# Patient Record
Sex: Female | Born: 1990 | Race: White | Hispanic: No | Marital: Married | Smoking: Never smoker
Health system: Southern US, Community
[De-identification: ages and names within clinical notes are randomized; demographics above are authoritative.]

## PROBLEM LIST (undated history)

## (undated) ENCOUNTER — Inpatient Hospital Stay (HOSPITAL_COMMUNITY): Payer: Self-pay

## (undated) DIAGNOSIS — Z8759 Personal history of other complications of pregnancy, childbirth and the puerperium: Secondary | ICD-10-CM

## (undated) DIAGNOSIS — Z87442 Personal history of urinary calculi: Secondary | ICD-10-CM

## (undated) DIAGNOSIS — N189 Chronic kidney disease, unspecified: Secondary | ICD-10-CM

## (undated) DIAGNOSIS — O009 Unspecified ectopic pregnancy without intrauterine pregnancy: Secondary | ICD-10-CM

## (undated) DIAGNOSIS — E039 Hypothyroidism, unspecified: Secondary | ICD-10-CM

## (undated) HISTORY — PX: WISDOM TOOTH EXTRACTION: SHX21

## (undated) HISTORY — PX: APPENDECTOMY: SHX54

## (undated) HISTORY — PX: LITHOTRIPSY: SUR834

---
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
## 2016-03-04 ENCOUNTER — Encounter (HOSPITAL_COMMUNITY): Payer: Self-pay | Admitting: Family Medicine

## 2016-03-04 ENCOUNTER — Ambulatory Visit (INDEPENDENT_AMBULATORY_CARE_PROVIDER_SITE_OTHER): Payer: Worker's Compensation

## 2016-03-04 ENCOUNTER — Ambulatory Visit (HOSPITAL_COMMUNITY)
Admission: EM | Admit: 2016-03-04 | Discharge: 2016-03-04 | Disposition: A | Discharge status: Home / Self Care | Payer: Worker's Compensation | Attending: Family Medicine | Admitting: Family Medicine

## 2016-03-04 DIAGNOSIS — S61233A Puncture wound without foreign body of left middle finger without damage to nail, initial encounter: Secondary | ICD-10-CM

## 2016-03-04 MED ORDER — DOXYCYCLINE HYCLATE 100 MG PO CAPS: 100.0000 mg | ORAL_CAPSULE | Freq: Two times a day (BID) | ORAL | 0 refills | Status: DC

## 2016-03-04 NOTE — ED Triage Notes (Signed)
Pt here for a piece of aluminum stuck in left middle finger.

## 2016-03-04 NOTE — ED Provider Notes (Signed)
CSN: [id number]     Arrival date & time 03/04/16  1709 History   First MD Initiated Contact with Patient 03/04/16 1805     Chief Complaint  Patient presents with  . Foreign Body   (Consider location/radiation/quality/duration/timing/severity/associated sxs/prior Treatment) The history is provided by the patient.  Foreign Body  Location:  Skin Suspected object:  Metal Pain quality:  Unable to specify Pain severity:  No pain Duration:  2 weeks Timing:  Constant Progression:  Partially resolved Chronicity:  New Worsened by:  Nothing Ineffective treatments:  None tried   History reviewed. No pertinent past medical history. History reviewed. No pertinent surgical history. History reviewed. No pertinent family history. Social History  Substance Use Topics  . Smoking status: Never Smoker  . Smokeless tobacco: Never Used  . Alcohol use Not on file   OB History    No data available     Review of Systems  Constitutional: Negative.   HENT: Negative.   Eyes: Negative.   Respiratory: Negative.   Cardiovascular: Negative.   Gastrointestinal: Negative.   Endocrine: Negative.   Genitourinary: Negative.   Musculoskeletal: Negative.   Skin: Positive for wound.  Allergic/Immunologic: Negative.     Allergies  Patient has no known allergies.  Home Medications   Prior to Admission medications   Medication Sig Start Date End Date Taking? Authorizing Provider  doxycycline (VIBRAMYCIN) 100 MG capsule Take 1 capsule (100 mg total) by mouth 2 (two) times daily. 03/04/16   Deatra CanterWilliam J Yurianna Tusing, FNP   Meds Ordered and Administered this Visit  Medications - No data to display  BP 121/81   Pulse 68   Temp 97.8 F (36.6 C)   Resp 18   SpO2 100%  No data found.   Physical Exam  Constitutional: She appears well-developed and well-nourished.  HENT:  Head: Normocephalic and atraumatic.  Eyes: Conjunctivae and EOM are normal. Pupils are equal, round, and reactive to light.   Cardiovascular: Normal rate, regular rhythm and normal heart sounds.   Pulmonary/Chest: Effort normal and breath sounds normal.  Skin:  Left middle finger with healing puncture wound MIP of left middle finger.  Nursing note and vitals reviewed.   Urgent Care Course   Clinical Course     Procedures (including critical care time)  Labs Review Labs Reviewed - No data to display  Imaging Review Dg Finger Middle Left  Result Date: 03/04/2016 CLINICAL DATA:  Persistent pain of the left middle finger after being greatest with metal grinder at work 1 month ago. Rule out foreign body. EXAM: LEFT MIDDLE FINGER 2+V COMPARISON:  None. FINDINGS: There is no evidence of fracture or dislocation. No radiopaque foreign body identified. Joint spaces are intact. There is no evidence of arthropathy or other focal bone abnormality. Soft tissues are unremarkable. IMPRESSION: Negative. Electronically Signed   By: Tollie Ethavid  Kwon M.D.   On: 03/04/2016 18:34     Visual Acuity Review  Right Eye Distance:   Left Eye Distance:   Bilateral Distance:    Right Eye Near:   Left Eye Near:    Bilateral Near:         MDM   1. Puncture wound of left middle finger    Reassured no FB seen in the skin or joint or finger.  Doxycycline 100mg  one po bid x 10 days #20      Deatra CanterWilliam J Maida Widger, FNP 03/04/16 1907

## 2017-02-24 ENCOUNTER — Other Ambulatory Visit: Payer: Self-pay | Admitting: Adult Health

## 2017-02-24 DIAGNOSIS — N6012 Diffuse cystic mastopathy of left breast: Secondary | ICD-10-CM

## 2017-02-24 DIAGNOSIS — Z8271 Family history of polycystic kidney: Secondary | ICD-10-CM

## 2017-02-26 ENCOUNTER — Other Ambulatory Visit: Payer: Self-pay | Admitting: Adult Health

## 2017-02-26 DIAGNOSIS — Z8271 Family history of polycystic kidney: Secondary | ICD-10-CM

## 2017-03-06 ENCOUNTER — Other Ambulatory Visit: Payer: Self-pay | Admitting: Adult Health

## 2017-03-06 ENCOUNTER — Ambulatory Visit
Admission: RE | Admit: 2017-03-06 | Discharge: 2017-03-06 | Disposition: A | Discharge status: Home / Self Care | Payer: BLUE CROSS/BLUE SHIELD | Source: Ambulatory Visit | Attending: Adult Health | Admitting: Adult Health

## 2017-03-06 DIAGNOSIS — N6012 Diffuse cystic mastopathy of left breast: Secondary | ICD-10-CM

## 2017-03-06 DIAGNOSIS — Z8271 Family history of polycystic kidney: Secondary | ICD-10-CM

## 2017-10-13 ENCOUNTER — Other Ambulatory Visit (HOSPITAL_COMMUNITY)
Admission: RE | Admit: 2017-10-13 | Discharge: 2017-10-13 | Disposition: A | Discharge status: Home / Self Care | Payer: BLUE CROSS/BLUE SHIELD | Source: Ambulatory Visit | Attending: Family Medicine | Admitting: Family Medicine

## 2017-10-13 ENCOUNTER — Other Ambulatory Visit: Payer: Self-pay | Admitting: Family Medicine

## 2017-10-13 DIAGNOSIS — Z01419 Encounter for gynecological examination (general) (routine) without abnormal findings: Secondary | ICD-10-CM | POA: Insufficient documentation

## 2017-10-15 LAB — CYTOLOGY - PAP: DIAGNOSIS: NEGATIVE

## 2019-01-10 ENCOUNTER — Other Ambulatory Visit: Payer: Self-pay

## 2019-01-10 DIAGNOSIS — Z20822 Contact with and (suspected) exposure to covid-19: Secondary | ICD-10-CM

## 2019-01-12 LAB — NOVEL CORONAVIRUS, NAA: SARS-CoV-2, NAA: NOT DETECTED

## 2019-06-13 ENCOUNTER — Inpatient Hospital Stay (HOSPITAL_COMMUNITY)
Admission: AD | Admit: 2019-06-13 | Discharge: 2019-06-13 | Disposition: A | Discharge status: Home / Self Care | Payer: 59 | Attending: Obstetrics and Gynecology | Admitting: Obstetrics and Gynecology

## 2019-06-13 ENCOUNTER — Other Ambulatory Visit: Payer: Self-pay

## 2019-06-13 ENCOUNTER — Encounter (HOSPITAL_COMMUNITY): Payer: Self-pay | Admitting: Obstetrics and Gynecology

## 2019-06-13 ENCOUNTER — Inpatient Hospital Stay (HOSPITAL_COMMUNITY): Payer: 59

## 2019-06-13 ENCOUNTER — Emergency Department (HOSPITAL_COMMUNITY)
Admission: EM | Admit: 2019-06-13 | Discharge: 2019-06-13 | Discharge status: Absent without leave | Payer: BLUE CROSS/BLUE SHIELD

## 2019-06-13 DIAGNOSIS — O26891 Other specified pregnancy related conditions, first trimester: Secondary | ICD-10-CM

## 2019-06-13 DIAGNOSIS — O009 Unspecified ectopic pregnancy without intrauterine pregnancy: Secondary | ICD-10-CM | POA: Insufficient documentation

## 2019-06-13 DIAGNOSIS — O00101 Right tubal pregnancy without intrauterine pregnancy: Secondary | ICD-10-CM | POA: Diagnosis not present

## 2019-06-13 DIAGNOSIS — N939 Abnormal uterine and vaginal bleeding, unspecified: Secondary | ICD-10-CM

## 2019-06-13 DIAGNOSIS — Z6791 Unspecified blood type, Rh negative: Secondary | ICD-10-CM

## 2019-06-13 LAB — URINALYSIS, ROUTINE W REFLEX MICROSCOPIC
Bacteria, UA: NONE SEEN
Bilirubin Urine: NEGATIVE
Glucose, UA: NEGATIVE mg/dL
Ketones, ur: NEGATIVE mg/dL
Leukocytes,Ua: NEGATIVE
Nitrite: NEGATIVE
Protein, ur: NEGATIVE mg/dL
Specific Gravity, Urine: 1.009 (ref 1.005–1.030)
pH: 6 (ref 5.0–8.0)

## 2019-06-13 LAB — COMPREHENSIVE METABOLIC PANEL
ALT: 14 U/L (ref 0–44)
AST: 17 U/L (ref 15–41)
Albumin: 4.2 g/dL (ref 3.5–5.0)
Alkaline Phosphatase: 38 U/L (ref 38–126)
Anion gap: 10 (ref 5–15)
BUN: 12 mg/dL (ref 6–20)
CO2: 24 mmol/L (ref 22–32)
Calcium: 9.4 mg/dL (ref 8.9–10.3)
Chloride: 102 mmol/L (ref 98–111)
Creatinine, Ser: 0.84 mg/dL (ref 0.44–1.00)
GFR calc Af Amer: >60 mL/min (ref >60)
GFR calc non Af Amer: >60 mL/min (ref >60)
Glucose, Bld: 94 mg/dL (ref 70–99)
Potassium: 4.1 mmol/L (ref 3.5–5.1)
Sodium: 136 mmol/L (ref 135–145)
Total Bilirubin: 0.7 mg/dL (ref 0.3–1.2)
Total Protein: 6.6 g/dL (ref 6.5–8.1)

## 2019-06-13 LAB — CBC
HCT: 41.8 % (ref 36.0–46.0)
Hemoglobin: 13.8 g/dL (ref 12.0–15.0)
MCH: 31.1 pg (ref 26.0–34.0)
MCHC: 33 g/dL (ref 30.0–36.0)
MCV: 94.1 fL (ref 80.0–100.0)
Platelets: 323 10*3/uL (ref 150–400)
RBC: 4.44 MIL/uL (ref 3.87–5.11)
RDW: 12 % (ref 11.5–15.5)
WBC: 9.1 10*3/uL (ref 4.0–10.5)
nRBC: 0 % (ref 0.0–0.2)

## 2019-06-13 LAB — WET PREP, GENITAL
Clue Cells Wet Prep HPF POC: NONE SEEN
Sperm: NONE SEEN
Trich, Wet Prep: NONE SEEN
Yeast Wet Prep HPF POC: NONE SEEN

## 2019-06-13 LAB — HCG, QUANTITATIVE, PREGNANCY: hCG, Beta Chain, Quant, S: 2959 m[IU]/mL — ABNORMAL HIGH (ref <5)

## 2019-06-13 LAB — ABO/RH: ABO/RH(D): A NEG

## 2019-06-13 LAB — TYPE AND SCREEN
ABO/RH(D): A NEG
Antibody Screen: NEGATIVE

## 2019-06-13 LAB — POCT PREGNANCY, URINE: Preg Test, Ur: POSITIVE — AB

## 2019-06-13 MED ORDER — METHOTREXATE FOR ECTOPIC PREGNANCY
50.0000 mg/m2 | Freq: Once | INTRAMUSCULAR | Status: AC
  Administered 2019-06-13: 75 mg via INTRAMUSCULAR
  Filled 2019-06-13: qty 1

## 2019-06-13 MED ORDER — HYDROMORPHONE HCL 1 MG/ML IJ SOLN
1.0000 mg | Freq: Once | INTRAMUSCULAR | Status: AC
  Administered 2019-06-13: 20:00:00 1 mg via INTRAVENOUS
  Filled 2019-06-13: qty 1

## 2019-06-13 MED ORDER — LACTATED RINGERS IV SOLN: INTRAVENOUS | Status: DC

## 2019-06-13 MED ORDER — RHO D IMMUNE GLOBULIN 1500 UNIT/2ML IJ SOSY
300.0000 ug | PREFILLED_SYRINGE | Freq: Once | INTRAMUSCULAR | Status: AC
  Administered 2019-06-13: 300 ug via INTRAVENOUS
  Filled 2019-06-13: qty 2

## 2019-06-13 NOTE — MAU Provider Note (Signed)
History     [id number]  Arrival date and time: 06/13/19 1734    Chief Complaint  Patient presents with  . Vaginal Bleeding  . Possible Pregnancy     HPI Emily Lin is a 29 y.o. at 64w5dby unsure LMP, who presents for vaginal bleeding and intermittent abdominal pain.   Is unsure of LMP Thought period started two weeks ago and hasn't stopped Thinks her last period prior to that was in the first week of March [facility] +[facility], sent here for further evaluation Bleeding is like her heaviest period, uses menstrual cup so unsure if she's having clots or not Denies any dizziness or light headedness Also having intermittent sharp intense pain in her abdomen, not worse on one side or the other, lasts for anywhere from seconds to hours The night before had pain whenever she moved Last week was also having nausea but none today Has taken ibuprofen several times (none today), felt like it did help with the pain   --/--/A NEG, A NEG [facility]formed at [facility] [address] [place] [address] (04/26 1909)  OB History    Gravida  1   Para      Term      Preterm      AB      Living        SAB      TAB      Ectopic      Multiple      Live Births              History reviewed. No pertinent past medical history.  History reviewed. No pertinent surgical history.  Family History  Problem Relation Age of Onset  . Breast cancer Maternal Grandmother     Social History   Socioeconomic History  . Marital status: Single    Spouse name: Not on file  . Number of children: Not on file  . Years of education: Not on file  . Highest education level: Not on file  Occupational History  . Not on file  Tobacco Use  . Smoking status: Never Smoker  . Smokeless tobacco: Never Used  Substance and Sexual Activity  . Alcohol use: Never  . Drug use: Never  . Sexual activity: Yes  Other Topics Concern  . Not on file  Social  History Narrative  . Not on file   Social Determinants of Health   Financial Resource Strain:   . Difficulty of Paying Living Expenses:   Food Insecurity:   . Worried About RCharity fundraiserin the Last Year:   . RArboriculturistin the Last Year:   Transportation Needs:   . LFilm/video editor(Medical):   .Marland KitchenLack of Transportation (Non-Medical):   Physical Activity:   . Days of Exercise per Week:   . Minutes of Exercise per Session:   Stress:   . Feeling of Stress :   Social Connections:   . Frequency of Communication with Friends and Family:   . Frequency of Social Gatherings with Friends and Family:   . Attends Religious Services:   . Active Member of Clubs or Organizations:   . Attends CArchivistMeetings:   .Marland KitchenMarital Status:   Intimate Partner Violence:   . Fear of Current or Ex-Partner:   . Emotionally Abused:   .Marland KitchenPhysically Abused:   . Sexually Abused:     No Known  Allergies  No current facility-administered medications on file prior to encounter.   Current Outpatient Medications on File Prior to Encounter  Medication Sig Dispense Refill  . doxycycline (VIBRAMYCIN) 100 MG capsule Take 1 capsule (100 mg total) by mouth 2 (two) times daily. 20 capsule 0     ROS Pertinent positives and negative per HPI, all others reviewed and negative  Physical Exam   BP 101/64   Pulse 90   Temp 98.2 F (36.8 C)   Resp 18   Ht 5' 3"  (1.6 m)   Wt 52.6 kg   LMP 04/27/2019   SpO2 99%   BMI 20.55 kg/m   Physical Exam  Vitals reviewed. Constitutional: She appears well-developed and well-nourished. No distress.  Eyes: No scleral icterus.  Respiratory: Effort normal. No respiratory distress.  GI: Soft. She exhibits no distension. There is no abdominal tenderness. There is no rebound and no guarding.  Genitourinary:    Genitourinary Comments: Blood present in vaginal vault, cervix closed   Musculoskeletal:        General: No edema.  Neurological: She is  alert. Coordination normal.  Skin: Skin is warm and dry. She is not diaphoretic.  Psychiatric: She has a normal mood and affect.    Cervical Exam  Not done  Bedside Ultrasound Not done  FHT N/a  Labs Results for orders placed or performed during the hospital encounter of 06/13/19 (from the past 24 hour(s))  Pregnancy, urine POC     Status: Abnormal   Collection Time: 06/13/19  5:47 PM  Result Value Ref Range   Preg Test, Ur POSITIVE (A) NEGATIVE  Urinalysis, Routine w reflex microscopic     Status: Abnormal   Collection Time: 06/13/19  5:52 PM  Result Value Ref Range   Color, Urine STRAW (A) YELLOW   APPearance CLEAR CLEAR   Specific Gravity, Urine 1.009 1.005 - 1.030   pH 6.0 5.0 - 8.0   Glucose, UA NEGATIVE NEGATIVE mg/dL   Hgb urine dipstick SMALL (A) NEGATIVE   Bilirubin Urine NEGATIVE NEGATIVE   Ketones, ur NEGATIVE NEGATIVE mg/dL   Protein, ur NEGATIVE NEGATIVE mg/dL   Nitrite NEGATIVE NEGATIVE   Leukocytes,Ua NEGATIVE NEGATIVE   RBC / HPF 6-10 0 - 5 RBC/hpf   WBC, UA 0-5 0 - 5 WBC/hpf   Bacteria, UA NONE SEEN NONE SEEN   Squamous Epithelial / LPF 0-5 0 - 5  Wet prep, genital     Status: Abnormal   Collection Time: 06/13/19  7:06 PM   Specimen: Vaginal  Result Value Ref Range   Yeast Wet Prep HPF POC NONE SEEN NONE SEEN   Trich, Wet Prep NONE SEEN NONE SEEN   Clue Cells Wet Prep HPF POC NONE SEEN NONE SEEN   WBC, Wet Prep HPF POC FEW (A) NONE SEEN   Sperm NONE SEEN   CBC     Status: None   Collection Time: 06/13/19  7:09 PM  Result Value Ref Range   WBC 9.1 4.0 - 10.5 K/uL   RBC 4.44 3.87 - 5.11 MIL/uL   Hemoglobin 13.8 12.0 - 15.0 g/dL   HCT 41.8 36.0 - 46.0 %   MCV 94.1 80.0 - 100.0 fL   MCH 31.1 26.0 - 34.0 pg   MCHC 33.0 30.0 - 36.0 g/dL   RDW 12.0 11.5 - 15.5 %   Platelets 323 150 - 400 K/uL   nRBC 0.0 0.0 - 0.2 %  Type and screen [place]  Status: None   Collection Time: 06/13/19  7:09 PM  Result Value Ref Range    ABO/RH(D) A NEG    Antibody Screen NEG    Sample Expiration      06/16/2019,2359 Performed at [facility], [place] [address], [place], [place] [address]   hCG, quantitative, pregnancy     Status: Abnormal   Collection Time: 06/13/19  7:09 PM  Result Value Ref Range   hCG, Beta Chain, Quant, S 2,959 (H) <5 mIU/mL  ABO/Rh     Status: None   Collection Time: 06/13/19  7:09 PM  Result Value Ref Range   ABO/RH(D)      A NEG Performed at [facility] [address] [place], [place] [address]   Comprehensive metabolic panel     Status: None   Collection Time: 06/13/19  7:09 PM  Result Value Ref Range   Sodium 136 135 - 145 mmol/L   Potassium 4.1 3.5 - 5.1 mmol/L   Chloride 102 98 - 111 mmol/L   CO2 24 22 - 32 mmol/L   Glucose, Bld 94 70 - 99 mg/dL   BUN 12 6 - 20 mg/dL   Creatinine, Ser 0.84 0.44 - 1.00 mg/dL   Calcium 9.4 8.9 - 10.3 mg/dL   Total Protein 6.6 6.5 - 8.1 g/dL   Albumin 4.2 3.5 - 5.0 g/dL   AST 17 15 - 41 U/L   ALT 14 0 - 44 U/L   Alkaline Phosphatase 38 38 - 126 U/L   Total Bilirubin 0.7 0.3 - 1.2 mg/dL   GFR calc non Af Amer >60 >60 mL/min   GFR calc Af Amer >60 >60 mL/min   Anion gap 10 5 - 15    Imaging US OB LESS THAN 14 WEEKS WITH OB TRANSVAGINAL  Result Date: 06/13/2019 CLINICAL DATA:  Positive pregnancy test with right-sided abdominal pain and vaginal bleeding EXAM: OBSTETRIC <14 WK [place] AND TRANSVAGINAL OB US TECHNIQUE: Both transabdominal and transvaginal ultrasound examinations were performed for complete evaluation of the gestation as well as the maternal uterus, adnexal regions, and pelvic cul-de-sac. Transvaginal technique was performed to assess early pregnancy. COMPARISON:  None. FINDINGS: Intrauterine gestational sac: Absent Maternal uterus/adnexae: Uterus is well visualized and within normal limits. Left adnexa is unremarkable. Right adnexa demonstrates a mildly echogenic mass lesion separate from the right ovary which measures  approximately 4.0 x 2.4 x 3.2 cm with increased blood flow peripherally suspicious for ectopic pregnancy. IMPRESSION: Findings in the right adnexa suspicious for right ectopic pregnancy adjacent to the right ovary. No intrauterine gestation is noted. Critical Value/emergent results were called by telephone at the time of interpretation on 06/13/2019 at 8:06 pm to Dr. Clayton Lefort , who verbally acknowledged these results. Electronically Signed   By: Inez Catalina M.D.   On: 06/13/2019 20:08    MAU Course  Procedures  Lab Orders     Wet prep, genital     Urinalysis, Routine w reflex microscopic     CBC     hCG, quantitative, pregnancy     Comprehensive metabolic panel     hCG, quantitative, pregnancy     Pregnancy, urine POC Meds ordered this encounter  Medications  . HYDROmorphone (DILAUDID) injection 1 mg  . lactated ringers infusion  . methotrexate (EMGYN) chemo injection kit 75 mg  . rho (d) immune globulin (RHIG/RHOPHYLAC) injection 300 mcg    Imaging Orders     US OB LESS THAN 14 WEEKS WITH OB  TRANSVAGINAL  MDM moderate  Assessment and Plan  #R ectopic pregnancy [place] c/w R ectopic, HCG 2900, blood type A neg. Baseline labs including CBC and CMP unremarkable. Since arrival worsening of abdominal pain after [place], given 54m IV dilaudid with improvement in pain. Seen w Dr. PVevelyn Francoisand offered MTX vs surgery, elects for MTX and agrees to close follow up. [facility] Counseled on warning signs for return and expected course for pain/bleeding. Counseled on importance of follow up and possibility of failure with MTX/need for surgery. Will give dose of methotrexate and rhogam prior to discharge.   MClarnce Flock

## 2019-06-13 NOTE — Discharge Instructions (Signed)
Ectopic Pregnancy ° °An ectopic pregnancy is when the fertilized egg attaches (implants) outside the uterus. Most ectopic pregnancies occur in one of the tubes where eggs travel from the ovary to the uterus (fallopian tubes), but the implanting can occur in other locations. In rare cases, ectopic pregnancies occur on the ovary, intestine, pelvis, abdomen, or cervix. In an ectopic pregnancy, the fertilized egg does not have the ability to develop into a normal, healthy baby. °A ruptured ectopic pregnancy is one in which tearing or bursting of a fallopian tube causes internal bleeding. Often, there is intense lower abdominal pain, and vaginal bleeding sometimes occurs. Having an ectopic pregnancy can be life-threatening. If this dangerous condition is not treated, it can lead to blood loss, shock, or even death. °What are the causes? °The most common cause of this condition is damage to one of the fallopian tubes. A fallopian tube may be narrowed or blocked, and that keeps the fertilized egg from reaching the uterus. °What increases the risk? °This condition is more likely to develop in women of childbearing age who have different levels of risk. The levels of risk can be divided into three categories. °High risk °· You have gone through infertility treatment. °· You have had an ectopic pregnancy before. °· You have had surgery on the fallopian tubes, or another surgical procedure, such as an abortion. °· You have had surgery to have the fallopian tubes tied (tubal ligation). °· You have problems or diseases of the fallopian tubes. °· You have been exposed to diethylstilbestrol (DES). This medicine was used until 1971, and it had effects on babies whose mothers took the medicine. °· You become pregnant while using an IUD (intrauterine device) for birth control. °Moderate risk °· You have a history of infertility. °· You have had an STI (sexually transmitted infection). °· You have a history of pelvic inflammatory  disease (PID). °· You have scarring from endometriosis. °· You have multiple sexual partners. °· You smoke. °Low risk °· You have had pelvic surgery. °· You use vaginal douches. °· You became sexually active before age 18. °What are the signs or symptoms? °Common symptoms of this condition include normal pregnancy symptoms, such as missing a period, nausea, tiredness, abdominal pain, breast tenderness, and bleeding. However, ectopic pregnancy will have additional symptoms, such as: °· Pain with intercourse. °· Irregular vaginal bleeding or spotting. °· Cramping or pain on one side or in the lower abdomen. °· Fast heartbeat, low blood pressure, and sweating. °· Passing out while having a bowel movement. °Symptoms of a ruptured ectopic pregnancy and internal bleeding may include: °· Sudden, severe pain in the abdomen and pelvis. °· Dizziness, weakness, light-headedness, or fainting. °· Pain in the shoulder or neck area. °How is this diagnosed? °This condition is diagnosed by: °· A pelvic exam to locate pain or a mass in the abdomen. °· A pregnancy test. This blood test checks for the presence as well as the specific level of pregnancy hormone in the bloodstream. °· Ultrasound. This is performed if a pregnancy test is positive. In this test, a probe is inserted into the vagina. The probe will detect a fetus, possibly in a location other than the uterus. °· Taking a sample of uterus tissue (dilation and curettage, or D&C). °· Surgery to perform a visual exam of the inside of the abdomen using a thin, lighted tube that has a tiny camera on the end (laparoscope). °· Culdocentesis. This procedure involves inserting a needle at the top of   the vagina, behind the uterus. If blood is present in this area, it may indicate that a fallopian tube is torn. How is this treated? This condition is treated with medicine or surgery. Medicine  An injection of a medicine (methotrexate) may be given to cause the pregnancy tissue to be  absorbed. This medicine may save your fallopian tube. It may be given if: ? The diagnosis is made early, with no signs of active bleeding. ? The fallopian tube has not ruptured. ? You are considered to be a good candidate for the medicine. Usually, pregnancy hormone blood levels are checked after methotrexate treatment. This is to be sure that the medicine is effective. It may take 4-6 weeks for the pregnancy to be absorbed. Most pregnancies will be absorbed by 3 weeks. Surgery  A laparoscope may be used to remove the pregnancy tissue.  If severe internal bleeding occurs, a larger cut (incision) may be made in the lower abdomen (laparotomy) to remove the fetus and placenta. This is done to stop the bleeding.  Part or all of the fallopian tube may be removed (salpingectomy) along with the fetus and placenta. The fallopian tube may also be repaired during the surgery.  In very rare circumstances, removal of the uterus (hysterectomy) may be required.  After surgery, pregnancy hormone testing may be done to be sure that there is no pregnancy tissue left. Whether your treatment is medicine or surgery, you may receive a Rho (D) immune globulin shot to prevent problems with any future pregnancy. This shot may be given if:  You are Rh-negative and the baby's father is Rh-positive.  You are Rh-negative and you do not know the Rh type of the baby's father. Follow these instructions at home:  Rest and limit your activity after the procedure for as long as told by your health care provider.  Until your health care provider says that it is safe: ? Do not lift anything that is heavier than 10 lb (4.5 kg), or the limit that your health care provider tells you. ? Avoid physical exercise and any movement that requires effort (is strenuous).  To help prevent constipation: ? Eat a healthy diet that includes fruits, vegetables, and whole grains. ? Drink 6-8 glasses of water per day. Get help right away  if:  You develop worsening pain that is not relieved by medicine.  You have: ? A fever or chills. ? Vaginal bleeding. ? Redness and swelling at the incision site. ? Nausea and vomiting.  You feel dizzy or weak.  You feel light-headed or you faint. This information is not intended to replace advice given to you by your health care provider. Make sure you discuss any questions you have with your health care provider. Document Revised: 01/16/2017 Document Reviewed: 09/05/2015 Elsevier Patient Education  2020 [employer] Methotrexate Treatment for an Ectopic Pregnancy, Care After This sheet gives you information about how to care for yourself after your procedure. Your health care provider may also give you more specific instructions. If you have problems or questions, contact your health care provider. What can I expect after the procedure? After the procedure, it is common to have:  Abdominal cramping.  Vaginal bleeding.  Fatigue.  Nausea.  Vomiting.  Diarrhea. Blood tests will be taken at timed intervals for several days or weeks to check your pregnancy hormone levels. The blood tests will be done until the pregnancy hormone can no longer be detected in the blood. Follow these instructions at home: Activity    Do not have sex until your health care provider approves.  Limit activities that take a lot of effort as told by your health care provider. Medicines  Take over the counter and prescription medicines only as told by your health care provider.  Do not take aspirin, ibuprofen, naproxen, or any other NSAIDs.  Do not take folic acid, prenatal vitamins, or other vitamins that contain folic acid. General instructions   Do not drink alcohol.  Follow instructions from your health care provider on how and when to report any symptoms that may indicate a ruptured ectopic pregnancy.  Keep all follow-up visits as told by your health care provider. This is  important. Contact a health care provider if:  You have persistent nausea and vomiting.  You have persistent diarrhea.  You are having a reaction to the medicine, such as: ? Tiredness. ? Skin rash. ? Hair loss. Get help right away if:  Your abdominal or pelvic pain gets worse.  You have more vaginal bleeding.  You feel light-headed or you faint.  You have shortness of breath.  Your heart rate increases.  You develop a cough.  You have chills.  You have a fever. Summary  After the procedure, it is common to have symptoms of abdominal cramping, vaginal bleeding and fatigue. You may also experience other symptoms.  Blood tests will be taken at timed intervals for several days or weeks to check your pregnancy hormone levels. The blood tests will be done until the pregnancy hormone can no longer be detected in the blood.  Limit strenuous activity as told by your health care provider.  Follow instructions from your health care provider on how and when to report any symptoms that may indicate a ruptured ectopic pregnancy. This information is not intended to replace advice given to you by your health care provider. Make sure you discuss any questions you have with your health care provider. Document Revised: 01/16/2017 Document Reviewed: 03/25/2016 Elsevier Patient Education  2020 Elsevier Inc.  

## 2019-06-13 NOTE — MAU Note (Signed)
.   Emily Lin is a 29 y.o. at [redacted]w[redacted]d here in MAU reporting: + home pregnancy test on April the 10th and it was positive, pt states she started having vaginal bleeding that day and has bled on and off since then, Has shooting pains at times but denies pain at present LMP: 04/27/19 Onset of complaint: ongoing Pain score: 0 Vitals:   06/13/19 1807  BP: 116/73  Pulse: 91  Resp: 18  Temp: 98.2 F (36.8 C)  SpO2: 100%     FHT: Lab orders placed from triage: UA/UPT

## 2019-06-14 LAB — GC/CHLAMYDIA PROBE AMP (~~LOC~~) NOT AT ARMC
Chlamydia: NEGATIVE
Comment: NEGATIVE
Comment: NORMAL
Neisseria Gonorrhea: NEGATIVE

## 2019-06-14 LAB — RH IG WORKUP (INCLUDES ABO/RH)
ABO/RH(D): A NEG
Gestational Age(Wks): 6
Unit division: 0

## 2019-06-16 ENCOUNTER — Other Ambulatory Visit: Payer: Self-pay

## 2019-06-16 ENCOUNTER — Telehealth: Payer: Self-pay | Admitting: *Deleted

## 2019-06-16 ENCOUNTER — Ambulatory Visit: Payer: 59 | Admitting: *Deleted

## 2019-06-16 DIAGNOSIS — O00101 Right tubal pregnancy without intrauterine pregnancy: Secondary | ICD-10-CM

## 2019-06-16 LAB — BETA HCG QUANT (REF LAB): hCG Quant: 810 m[IU]/mL

## 2019-06-16 NOTE — Telephone Encounter (Signed)
LM for pt to call back for her results

## 2019-06-16 NOTE — Telephone Encounter (Signed)
Pt informed of HCG results and to follow up Sunday in MAU for day 7 HCG

## 2019-06-19 ENCOUNTER — Inpatient Hospital Stay (HOSPITAL_COMMUNITY)
Admission: AD | Admit: 2019-06-19 | Discharge: 2019-06-19 | Disposition: A | Discharge status: Home / Self Care | Payer: 59 | Attending: Obstetrics & Gynecology | Admitting: Obstetrics & Gynecology

## 2019-06-19 ENCOUNTER — Other Ambulatory Visit: Payer: Self-pay

## 2019-06-19 DIAGNOSIS — Z3A01 Less than 8 weeks gestation of pregnancy: Secondary | ICD-10-CM | POA: Diagnosis not present

## 2019-06-19 DIAGNOSIS — O00101 Right tubal pregnancy without intrauterine pregnancy: Secondary | ICD-10-CM | POA: Insufficient documentation

## 2019-06-19 DIAGNOSIS — R110 Nausea: Secondary | ICD-10-CM | POA: Diagnosis not present

## 2019-06-19 DIAGNOSIS — Z8759 Personal history of other complications of pregnancy, childbirth and the puerperium: Secondary | ICD-10-CM | POA: Diagnosis not present

## 2019-06-19 DIAGNOSIS — R52 Pain, unspecified: Secondary | ICD-10-CM | POA: Insufficient documentation

## 2019-06-19 DIAGNOSIS — O0910 Supervision of pregnancy with history of ectopic or molar pregnancy, unspecified trimester: Secondary | ICD-10-CM | POA: Diagnosis not present

## 2019-06-19 LAB — HCG, QUANTITATIVE, PREGNANCY: hCG, Beta Chain, Quant, S: 708 m[IU]/mL — ABNORMAL HIGH (ref <5)

## 2019-06-19 NOTE — MAU Note (Signed)
Emily Lin is a 29 y.o. at [redacted]w[redacted]d here in MAU reporting: here for day 7 labs post mtx. Still having some pain and bleeding. Bleeding has remained the same.   Pain score: 3/10  Vitals:   06/19/19 1326  BP: 104/66  Pulse: 83  Resp: 16  Temp: 98.7 F (37.1 C)  SpO2: 100%     Lab orders placed from triage: hcg

## 2019-06-19 NOTE — MAU Provider Note (Signed)
Chief Complaint: Follow-up   First Provider Initiated Contact with Patient 06/19/19 1540      SUBJECTIVE HPI: Emily Lin is a 29 y.o. G1P0 at [redacted]w[redacted]d who presents to maternity admissions for f/u HCG after methotrexate therapy for right tubal ectopic pregnancy. This is actually day 6, last level drawn on day 4. Patient reports that on day of diagnosis she had a lot of pain on right side of abdomen after TVUS. Pain has mostly subsided and she has only occasionally taken Tylenol. Denies pain currently. Bleeding has persisted but not as heavy as previously. Patient is wearing padded underwear but does not equate bleeding to very heavy. Some nausea after receiving Methotrexate but none since.  She denies vaginal itching/burning, urinary symptoms, h/a, dizziness, n/v, or fever/chills.    No past medical history on file. No past surgical history on file. Social History   Socioeconomic History  . Marital status: Single    Spouse name: Not on file  . Number of children: Not on file  . Years of education: Not on file  . Highest education level: Not on file  Occupational History  . Not on file  Tobacco Use  . Smoking status: Never Smoker  . Smokeless tobacco: Never Used  Substance and Sexual Activity  . Alcohol use: Never  . Drug use: Never  . Sexual activity: Yes  Other Topics Concern  . Not on file  Social History Narrative  . Not on file   Social Determinants of Health   Financial Resource Strain:   . Difficulty of Paying Living Expenses:   Food Insecurity:   . Worried About Charity fundraiser in the Last Year:   . Arboriculturist in the Last Year:   Transportation Needs:   . Film/video editor (Medical):   Marland Kitchen Lack of Transportation (Non-Medical):   Physical Activity:   . Days of Exercise per Week:   . Minutes of Exercise per Session:   Stress:   . Feeling of Stress :   Social Connections:   . Frequency of Communication with Friends and Family:   . Frequency of Social  Gatherings with Friends and Family:   . Attends Religious Services:   . Active Member of Clubs or Organizations:   . Attends Archivist Meetings:   Marland Kitchen Marital Status:   Intimate Partner Violence:   . Fear of Current or Ex-Partner:   . Emotionally Abused:   Marland Kitchen Physically Abused:   . Sexually Abused:    No current facility-administered medications on file prior to encounter.   No current outpatient medications on file prior to encounter.   No Known Allergies  ROS:  Review of Systems All other systems negative unless noted above in HPI.   I have reviewed patient's Past Medical Hx, Surgical Hx, Family Hx, Social Hx, medications and allergies.   Physical Exam   Patient Vitals for the past 24 hrs:  BP Temp Temp src Pulse Resp SpO2  06/19/19 1326 104/66 98.7 F (37.1 C) Oral 83 16 100 %   Constitutional: Well-developed, well-nourished female in no acute distress.  Cardiovascular: normal rate Respiratory: normal effort GI: Abd soft, non-tender.  MS: Extremities nontender, no edema, normal ROM Neurologic: Alert and oriented x 4.  Psych: normal mood and affect  LAB RESULTS Results for orders placed or performed during the hospital encounter of 06/19/19 (from the past 24 hour(s))  hCG, quantitative, pregnancy     Status: Abnormal   Collection Time: 06/19/19  1:10 PM  Result Value Ref Range   hCG, Beta Chain, Quant, S 708 (H) <5 mIU/mL    --/--/A NEG (04/26 2205)  IMAGING US OB LESS THAN 14 WEEKS WITH OB TRANSVAGINAL  Result Date: 06/13/2019 CLINICAL DATA:  Positive pregnancy test with right-sided abdominal pain and vaginal bleeding EXAM: OBSTETRIC <14 WK [place] AND TRANSVAGINAL OB US TECHNIQUE: Both transabdominal and transvaginal ultrasound examinations were performed for complete evaluation of the gestation as well as the maternal uterus, adnexal regions, and pelvic cul-de-sac. Transvaginal technique was performed to assess early pregnancy. COMPARISON:  None. FINDINGS:  Intrauterine gestational sac: Absent Maternal uterus/adnexae: Uterus is well visualized and within normal limits. Left adnexa is unremarkable. Right adnexa demonstrates a mildly echogenic mass lesion separate from the right ovary which measures approximately 4.0 x 2.4 x 3.2 cm with increased blood flow peripherally suspicious for ectopic pregnancy. IMPRESSION: Findings in the right adnexa suspicious for right ectopic pregnancy adjacent to the right ovary. No intrauterine gestation is noted. Critical Value/emergent results were called by telephone at the time of interpretation on 06/13/2019 at 8:06 pm to Dr. Merian Capron , who verbally acknowledged these results. Electronically Signed   By: Alcide Clever M.D.   On: 06/13/2019 20:08    MAU Management/MDM: Orders Placed This Encounter  Procedures  . hCG, quantitative, pregnancy  . Discharge patient    No orders of the defined types were placed in this encounter.   Patient presented for f/u after Methotrexate therapy for right tubal ectopic pregnancy.  HCG quant largely decreased from day 1 to day 4. [phone] and now 708. This is only a  12.5% decrease but today is day 6 and patient has had a large drop from initial HCG. D/w Dr. Debroah Loop and in agreement that we will not give another methotrexate today. Appt requested at [facility] for lab f/u on 3/10 for quant HCG. Patient verbalized understanding of discharge and follow-up instructions and was ambulating without assistance upon discharge. Pt discharged with strict return precautions.  ASSESSMENT 1. History of ectopic pregnancy     PLAN Discharge home Allergies as of 06/19/2019   No Known Allergies     Medication List    You have not been prescribed any medications.    Follow-up Information    Center for [employer] [facility]. Go in 8 day(s).   Specialty: Obstetrics and Gynecology Contact information: [address] [place] [address] [phone]           Jerilynn Birkenhead, MD [facility] Fellow, [facility], [facility] 06/19/2019  4:18 PM

## 2019-06-24 ENCOUNTER — Inpatient Hospital Stay (HOSPITAL_COMMUNITY): Payer: 59

## 2019-06-24 ENCOUNTER — Encounter (HOSPITAL_COMMUNITY): Payer: Self-pay | Admitting: Obstetrics and Gynecology

## 2019-06-24 ENCOUNTER — Inpatient Hospital Stay (HOSPITAL_COMMUNITY)
Admission: AD | Admit: 2019-06-24 | Discharge: 2019-06-24 | Disposition: A | Discharge status: Home / Self Care | Payer: 59 | Attending: Obstetrics and Gynecology | Admitting: Obstetrics and Gynecology

## 2019-06-24 DIAGNOSIS — R102 Pelvic and perineal pain: Secondary | ICD-10-CM | POA: Diagnosis not present

## 2019-06-24 DIAGNOSIS — Z87442 Personal history of urinary calculi: Secondary | ICD-10-CM | POA: Diagnosis not present

## 2019-06-24 DIAGNOSIS — N200 Calculus of kidney: Secondary | ICD-10-CM | POA: Insufficient documentation

## 2019-06-24 DIAGNOSIS — N189 Chronic kidney disease, unspecified: Secondary | ICD-10-CM | POA: Insufficient documentation

## 2019-06-24 DIAGNOSIS — O26899 Other specified pregnancy related conditions, unspecified trimester: Secondary | ICD-10-CM

## 2019-06-24 DIAGNOSIS — R109 Unspecified abdominal pain: Secondary | ICD-10-CM | POA: Diagnosis not present

## 2019-06-24 DIAGNOSIS — R1031 Right lower quadrant pain: Secondary | ICD-10-CM | POA: Diagnosis not present

## 2019-06-24 DIAGNOSIS — O00201 Right ovarian pregnancy without intrauterine pregnancy: Secondary | ICD-10-CM

## 2019-06-24 DIAGNOSIS — O26831 Pregnancy related renal disease, first trimester: Secondary | ICD-10-CM | POA: Insufficient documentation

## 2019-06-24 DIAGNOSIS — M545 Low back pain, unspecified: Secondary | ICD-10-CM

## 2019-06-24 DIAGNOSIS — O26891 Other specified pregnancy related conditions, first trimester: Secondary | ICD-10-CM | POA: Diagnosis not present

## 2019-06-24 DIAGNOSIS — Z3A08 8 weeks gestation of pregnancy: Secondary | ICD-10-CM | POA: Insufficient documentation

## 2019-06-24 DIAGNOSIS — O009 Unspecified ectopic pregnancy without intrauterine pregnancy: Secondary | ICD-10-CM | POA: Diagnosis not present

## 2019-06-24 DIAGNOSIS — Z6791 Unspecified blood type, Rh negative: Secondary | ICD-10-CM

## 2019-06-24 DIAGNOSIS — Z8759 Personal history of other complications of pregnancy, childbirth and the puerperium: Secondary | ICD-10-CM | POA: Insufficient documentation

## 2019-06-24 DIAGNOSIS — R110 Nausea: Secondary | ICD-10-CM | POA: Insufficient documentation

## 2019-06-24 DIAGNOSIS — O09292 Supervision of pregnancy with other poor reproductive or obstetric history, second trimester: Secondary | ICD-10-CM | POA: Diagnosis not present

## 2019-06-24 DIAGNOSIS — O4690 Antepartum hemorrhage, unspecified, unspecified trimester: Secondary | ICD-10-CM

## 2019-06-24 HISTORY — DX: Chronic kidney disease, unspecified: N18.9

## 2019-06-24 LAB — CBC WITH DIFFERENTIAL/PLATELET
Abs Immature Granulocytes: 0.06 10*3/uL (ref 0.00–0.07)
Basophils Absolute: 0.1 10*3/uL (ref 0.0–0.1)
Basophils Relative: 1 %
Eosinophils Absolute: 0.2 10*3/uL (ref 0.0–0.5)
Eosinophils Relative: 2 %
HCT: 40.8 % (ref 36.0–46.0)
Hemoglobin: 13.6 g/dL (ref 12.0–15.0)
Immature Granulocytes: 1 %
Lymphocytes Relative: 13 %
Lymphs Abs: 1.4 10*3/uL (ref 0.7–4.0)
MCH: 31.6 pg (ref 26.0–34.0)
MCHC: 33.3 g/dL (ref 30.0–36.0)
MCV: 94.7 fL (ref 80.0–100.0)
Monocytes Absolute: 0.5 10*3/uL (ref 0.1–1.0)
Monocytes Relative: 4 %
Neutro Abs: 8.9 10*3/uL — ABNORMAL HIGH (ref 1.7–7.7)
Neutrophils Relative %: 79 %
Platelets: 347 10*3/uL (ref 150–400)
RBC: 4.31 MIL/uL (ref 3.87–5.11)
RDW: 11.9 % (ref 11.5–15.5)
WBC: 11.1 10*3/uL — ABNORMAL HIGH (ref 4.0–10.5)
nRBC: 0 % (ref 0.0–0.2)

## 2019-06-24 LAB — URINALYSIS, ROUTINE W REFLEX MICROSCOPIC
Bilirubin Urine: NEGATIVE
Glucose, UA: NEGATIVE mg/dL
Ketones, ur: NEGATIVE mg/dL
Leukocytes,Ua: NEGATIVE
Nitrite: NEGATIVE
Protein, ur: NEGATIVE mg/dL
Specific Gravity, Urine: 1.008 (ref 1.005–1.030)
pH: 7 (ref 5.0–8.0)

## 2019-06-24 LAB — COMPREHENSIVE METABOLIC PANEL
ALT: 17 U/L (ref 0–44)
AST: 16 U/L (ref 15–41)
Albumin: 4.2 g/dL (ref 3.5–5.0)
Alkaline Phosphatase: 37 U/L — ABNORMAL LOW (ref 38–126)
Anion gap: 10 (ref 5–15)
BUN: 10 mg/dL (ref 6–20)
CO2: 24 mmol/L (ref 22–32)
Calcium: 9.4 mg/dL (ref 8.9–10.3)
Chloride: 102 mmol/L (ref 98–111)
Creatinine, Ser: 0.61 mg/dL (ref 0.44–1.00)
GFR calc Af Amer: >60 mL/min (ref >60)
GFR calc non Af Amer: >60 mL/min (ref >60)
Glucose, Bld: 93 mg/dL (ref 70–99)
Potassium: 4 mmol/L (ref 3.5–5.1)
Sodium: 136 mmol/L (ref 135–145)
Total Bilirubin: 0.6 mg/dL (ref 0.3–1.2)
Total Protein: 7 g/dL (ref 6.5–8.1)

## 2019-06-24 LAB — HCG, QUANTITATIVE, PREGNANCY: hCG, Beta Chain, Quant, S: 338 m[IU]/mL — ABNORMAL HIGH (ref <5)

## 2019-06-24 LAB — LIPASE, BLOOD: Lipase: 29 U/L (ref 11–51)

## 2019-06-24 LAB — AMYLASE: Amylase: 50 U/L (ref 28–100)

## 2019-06-24 NOTE — MAU Provider Note (Signed)
History     CSN: [id number]  Arrival date and time: 06/24/19 1420   First Provider Initiated Contact with Patient 06/24/19 1546      Chief Complaint  Patient presents with  . Abdominal Pain   [name] is a 29 y.o. G1P0 at [redacted]w[redacted]d who presents to MAU for a sudden spike of pain that happened this afternoon. Patient endorses a history of kidney stones. Patient reports the pain has subsided since then. Pt is s/p MTX for right ectopic pregnancy on 06/13/2019.  Onset: 1PM Location: RLQ/right flank Duration: <24hrs Character: sharp, "feels like its deep in the core of me", sometimes more in back than abdomen, difficult to get comfortable Aggravating/Associated: movement/nausea without vomiting, VB that has lessened since receiving MTX Relieving: lying still Treatment: none Severity: 2/10  Pt denies VB, vaginal discharge/odor/itching. Pt denies vomiting, constipation, diarrhea, or urinary problems. Pt denies fever, chills, fatigue, sweating or changes in appetite. Pt denies SOB or chest pain. Pt denies dizziness, HA, light-headedness, weakness.  Allergies? NKDA Current medications/supplements? none Prenatal care provider?  lab appt 06/27/2019   OB History    Gravida  1   Para      Term      Preterm      AB      Living        SAB      TAB      Ectopic      Multiple      Live Births              Past Medical History:  Diagnosis Date  . Chronic kidney disease    Hx Kidney Stones    Past Surgical History:  Procedure Laterality Date  . APPENDECTOMY    . LITHOTRIPSY    . WISDOM TOOTH EXTRACTION Bilateral     Family History  Problem Relation Age of Onset  . Breast cancer Maternal Grandmother   . Healthy Mother   . Healthy Father     Social History   Tobacco Use  . Smoking status: Never Smoker  . Smokeless tobacco: Never Used  Substance Use Topics  . Alcohol use: Not Currently    Comment: Socially  . Drug use: Never    Allergies: No  Known Allergies  Medications Prior to Admission  Medication Sig Dispense Refill Last Dose  . acetaminophen (TYLENOL) 325 MG tablet Take 650 mg by mouth every 6 (six) hours as needed for mild pain or headache.   Past Week at Unknown time    Review of Systems  Constitutional: Negative for chills, diaphoresis, fatigue and fever.  Eyes: Negative for visual disturbance.  Respiratory: Negative for shortness of breath.   Cardiovascular: Negative for chest pain.  Gastrointestinal: Positive for abdominal pain and nausea. Negative for constipation, diarrhea and vomiting.  Genitourinary: Positive for vaginal bleeding. Negative for dysuria, flank pain, frequency, pelvic pain, urgency and vaginal discharge.  Musculoskeletal: Positive for back pain.  Neurological: Negative for dizziness, weakness, light-headedness and headaches.   Physical Exam   Blood pressure 108/60, pulse 94, temperature 98.7 F (37.1 C), temperature source Oral, resp. rate 16, height 5\' 3"  (1.6 m), weight 50.8 kg, last menstrual period 04/27/2019, SpO2 100 %.  Patient Vitals for the past 24 hrs:  BP Temp Temp src Pulse Resp SpO2 Height Weight  06/24/19 1448 108/60 98.7 F (37.1 C) Oral 94 16 100 % 5\' 3"  (1.6 m) 50.8 kg   Physical Exam  Constitutional: She is oriented to person, place,  and time. She appears well-developed and well-nourished. No distress.  HENT:  Head: Normocephalic and atraumatic.  Respiratory: Effort normal.  GI: Soft. She exhibits no distension and no mass. There is abdominal tenderness in the right lower quadrant and suprapubic area. There is CVA tenderness (right side). There is no rebound and no guarding.  Genitourinary:    No vaginal discharge.   Neurological: She is alert and oriented to person, place, and time.  Skin: Skin is warm and dry. She is not diaphoretic.  Psychiatric: She has a normal mood and affect. Her behavior is normal. Judgment and thought content normal.   Results for orders placed  or performed during the hospital encounter of 06/24/19 (from the past 24 hour(s))  Urinalysis, Routine w reflex microscopic     Status: Abnormal   Collection Time: 06/24/19  3:02 PM  Result Value Ref Range   Color, Urine YELLOW YELLOW   APPearance CLEAR CLEAR   Specific Gravity, Urine 1.008 1.005 - 1.030   pH 7.0 5.0 - 8.0   Glucose, UA NEGATIVE NEGATIVE mg/dL   Hgb urine dipstick LARGE (A) NEGATIVE   Bilirubin Urine NEGATIVE NEGATIVE   Ketones, ur NEGATIVE NEGATIVE mg/dL   Protein, ur NEGATIVE NEGATIVE mg/dL   Nitrite NEGATIVE NEGATIVE   Leukocytes,Ua NEGATIVE NEGATIVE   RBC / HPF 11-20 0 - 5 RBC/hpf   WBC, UA 0-5 0 - 5 WBC/hpf   Bacteria, UA RARE (A) NONE SEEN   Squamous Epithelial / LPF 0-5 0 - 5  hCG, quantitative, pregnancy     Status: Abnormal   Collection Time: 06/24/19  5:03 PM  Result Value Ref Range   hCG, Beta Chain, Quant, S 338 (H) <5 mIU/mL  CBC with Differential/Platelet     Status: Abnormal   Collection Time: 06/24/19  5:03 PM  Result Value Ref Range   WBC 11.1 (H) 4.0 - 10.5 K/uL   RBC 4.31 3.87 - 5.11 MIL/uL   Hemoglobin 13.6 12.0 - 15.0 g/dL   HCT 99.2 42.6 - 83.4 %   MCV 94.7 80.0 - 100.0 fL   MCH 31.6 26.0 - 34.0 pg   MCHC 33.3 30.0 - 36.0 g/dL   RDW 19.6 22.2 - 97.9 %   Platelets 347 150 - 400 K/uL   nRBC 0.0 0.0 - 0.2 %   Neutrophils Relative % 79 %   Neutro Abs 8.9 (H) 1.7 - 7.7 K/uL   Lymphocytes Relative 13 %   Lymphs Abs 1.4 0.7 - 4.0 K/uL   Monocytes Relative 4 %   Monocytes Absolute 0.5 0.1 - 1.0 K/uL   Eosinophils Relative 2 %   Eosinophils Absolute 0.2 0.0 - 0.5 K/uL   Basophils Relative 1 %   Basophils Absolute 0.1 0.0 - 0.1 K/uL   Immature Granulocytes 1 %   Abs Immature Granulocytes 0.06 0.00 - 0.07 K/uL  Comprehensive metabolic panel     Status: Abnormal   Collection Time: 06/24/19  5:03 PM  Result Value Ref Range   Sodium 136 135 - 145 mmol/L   Potassium 4.0 3.5 - 5.1 mmol/L   Chloride 102 98 - 111 mmol/L   CO2 24 22 - 32  mmol/L   Glucose, Bld 93 70 - 99 mg/dL   BUN 10 6 - 20 mg/dL   Creatinine, Ser 8.92 0.44 - 1.00 mg/dL   Calcium 9.4 8.9 - 11.9 mg/dL   Total Protein 7.0 6.5 - 8.1 g/dL   Albumin 4.2 3.5 - 5.0 g/dL   AST  16 15 - 41 U/L   ALT 17 0 - 44 U/L   Alkaline Phosphatase 37 (L) 38 - 126 U/L   Total Bilirubin 0.6 0.3 - 1.2 mg/dL   GFR calc non Af Amer >60 >60 mL/min   GFR calc Af Amer >60 >60 mL/min   Anion gap 10 5 - 15  Amylase     Status: None   Collection Time: 06/24/19  5:03 PM  Result Value Ref Range   Amylase 50 28 - 100 U/L  Lipase, blood     Status: None   Collection Time: 06/24/19  5:03 PM  Result Value Ref Range   Lipase 29 11 - 51 U/L   US OB Transvaginal  Result Date: 06/24/2019 CLINICAL DATA:  Known ectopic with sudden onset of pain EXAM: TRANSVAGINAL OB ULTRASOUND TECHNIQUE: Transvaginal ultrasound was performed for complete evaluation of the gestation as well as the maternal uterus, adnexal regions, and pelvic cul-de-sac. COMPARISON:  06/13/2018 FINDINGS: Intrauterine gestational sac: Not visualized Yolk sac:  Not visualized Embryo:  Not visualized Maternal uterus/adnexae: Left ovary is normal and measures 3.7 x 2.7 x 2.2 cm. The right ovary measures 3 x 2.9 by 3.3 cm. Echogenic complex mass in the right adnexa measuring 4.6 x 2.1 by 4.2 cm, corresponding to history of ectopic pregnancy, previously measuring 4 x 2.4 x 3.2 cm. New moderate complex free fluid in the pelvis. On cine images there appears to be clot within the fluid. IMPRESSION: Redemonstrated right adnexal ectopic pregnancy, slight interval increase in size, but now with moderate slightly complex pelvic free fluid raising concern for rupture. Critical Value/emergent results were called by telephone at the time of interpretation on 06/24/2019 at 6:33 pm to provider Melanie for Darra Rosa , who verbally acknowledged these results. Electronically Signed   By: Jasmine Pang M.D.   On: 06/24/2019 18:52   US RENAL  Result  Date: 06/24/2019 CLINICAL DATA:  Right-sided abdominal pain. The patient has a known right ectopic pregnancy. History of kidney stones. EXAM: RENAL / URINARY TRACT ULTRASOUND COMPLETE COMPARISON:  None. FINDINGS: Right Kidney: Renal measurements: 10.8 x 3.7 x 5 cm = volume: 102 mL. There is no hydronephrosis. There is a 6 mm nonobstructing stone in the interpolar region of the right kidney. Left Kidney: Renal measurements: 9.8 x 5.6 x 4.5 cm = volume: 128 mL. Echogenicity within normal limits. No mass or hydronephrosis visualized. Bladder: Appears normal for degree of bladder distention. Both ureteral jets were visualized. Other: None. IMPRESSION: 1. No acute abnormality.  No hydronephrosis. 2. There is a nonobstructing 6 mm stone in the interpolar region of the right kidney. Electronically Signed   By: Katherine Mantle M.D.   On: 06/24/2019 18:23   US OB LESS THAN 14 WEEKS WITH OB TRANSVAGINAL  Result Date: 06/13/2019 CLINICAL DATA:  Positive pregnancy test with right-sided abdominal pain and vaginal bleeding EXAM: OBSTETRIC <14 WK [place] AND TRANSVAGINAL OB US TECHNIQUE: Both transabdominal and transvaginal ultrasound examinations were performed for complete evaluation of the gestation as well as the maternal uterus, adnexal regions, and pelvic cul-de-sac. Transvaginal technique was performed to assess early pregnancy. COMPARISON:  None. FINDINGS: Intrauterine gestational sac: Absent Maternal uterus/adnexae: Uterus is well visualized and within normal limits. Left adnexa is unremarkable. Right adnexa demonstrates a mildly echogenic mass lesion separate from the right ovary which measures approximately 4.0 x 2.4 x 3.2 cm with increased blood flow peripherally suspicious for ectopic pregnancy. IMPRESSION: Findings in the right adnexa suspicious for right ectopic  pregnancy adjacent to the right ovary. No intrauterine gestation is noted. Critical Value/emergent results were called by telephone at the time of  interpretation on 06/13/2019 at 8:06 pm to Dr. Merian CapronMATTHEW ECKSTAT , who verbally acknowledged these results. Electronically Signed   By: Alcide CleverMark  Lukens M.D.   On: 06/13/2019 20:08    MAU Course  Procedures  MDM -s/p MTX on 06/13/2019 with appropriately dropping hCG here with RLQ and right flank pain that is improving since onset this afternoon with known hx of kidney stones -VB that has improved since starting MTX, RH NEGATIVE, RhoGAM given 06/13/2019 -nausea without vomiting, temp 98.7, normal vitals -UA: lg hgb/rare bacteria, urine sent for culture -hCG: 338 -CBC w/ Diff: WBCs 11.1, otherwise WNL -CMP: WNL -Amylase: WNL -Lipase: WNL -Renal US: 1. No acute abnormality.  No hydronephrosis. 2. There is a nonobstructing 6 mm stone in the interpolar region of the right kidney. -Pelvic US: Redemonstrated right adnexal ectopic pregnancy, slight interval increase in size, but now with moderate slightly complex pelvic free fluid raising concern for rupture. -consulted with Dr. Despina HiddenEure regarding US results who reviewed the images and confirmed this is a normal finding of a resolving ectopic pregnancy after methotrexate, and if she was ruptured we would see a larger amount of fluid and progressively worsening pain and unstable vital signs; for kidney stone, nothing else to do at this time. Patient able to be discharged home. Per Dr. Despina HiddenEure, [facility] for one week from today, patient agreeable to change. -pt discharged to home in stable condition  Orders Placed This Encounter  Procedures  . Culture, OB Urine    Standing Status:   Standing    Number of Occurrences:   1  . US RENAL    Standing Status:   Standing    Number of Occurrences:   1    Order Specific Question:   Symptom/Reason for Exam    Answer:   Right flank pain [[id number]]    Order Specific Question:   Symptom/Reason for Exam    Answer:   Ectopic pregnancy of right ovary [[id number]]  . US OB Transvaginal     Standing Status:   Standing    Number of Occurrences:   1    Order Specific Question:   Symptom/Reason for Exam    Answer:   Right flank pain [[id number]]    Order Specific Question:   Symptom/Reason for Exam    Answer:   Ectopic pregnancy of right ovary [[id number]]  . Urinalysis, Routine w reflex microscopic    Standing Status:   Standing    Number of Occurrences:   1  . hCG, quantitative, pregnancy    Standing Status:   Standing    Number of Occurrences:   1  . CBC with Differential/Platelet    Standing Status:   Standing    Number of Occurrences:   1  . Comprehensive metabolic panel    Standing Status:   Standing    Number of Occurrences:   1  . Amylase    Standing Status:   Standing    Number of Occurrences:   1  . Lipase, blood    Standing Status:   Standing    Number of Occurrences:   1  . Diet NPO time specified    Standing Status:   Standing    Number of Occurrences:   1  . Discharge patient    Order Specific Question:   Discharge disposition  Answer:   01-Home or Self Care [1]    Order Specific Question:   Discharge patient date    Answer:   06/24/2019   No orders of the defined types were placed in this encounter.   Assessment and Plan   1. Right kidney stone   2. Right flank pain   3. Ectopic pregnancy of right ovary   4. Vaginal bleeding in pregnancy   5. Rh negative state in antepartum period   6. Nausea   7. RLQ abdominal pain   8. Acute right-sided low back pain without sciatica    Allergies as of 06/24/2019   No Known Allergies     Medication List    TAKE these medications   acetaminophen 325 MG tablet Commonly known as: TYLENOL Take 650 mg by mouth every 6 (six) hours as needed for mild pain or headache.      -discussed results in depth -s/sx of obstructing kidney stone/pyelonephritis discussed -s/sx of ruptured ectopic discussed -return MAU/return ED precautions given -message sent to [facility] to reschedule patient visit for one week from  today -pt discharged to home in stable condition  Elmyra Ricks E Rasheedah Reis 06/24/2019, 7:31 PM

## 2019-06-24 NOTE — MAU Note (Signed)
As here on the 26th, dx with ectopic preg.  Things have been going well, numbers have dropping. Today had a sharp spike in pain. Pain is in RLQ, side and flank area.  Subsides when she sits still for awhile. Has decreased from when she called, at that time was also having nausea

## 2019-06-24 NOTE — Discharge Instructions (Signed)
Ectopic Pregnancy ° °An ectopic pregnancy is when the fertilized egg attaches (implants) outside the uterus. Most ectopic pregnancies occur in one of the tubes where eggs travel from the ovary to the uterus (fallopian tubes), but the implanting can occur in other locations. In rare cases, ectopic pregnancies occur on the ovary, intestine, pelvis, abdomen, or cervix. In an ectopic pregnancy, the fertilized egg does not have the ability to develop into a normal, healthy baby. °A ruptured ectopic pregnancy is one in which tearing or bursting of a fallopian tube causes internal bleeding. Often, there is intense lower abdominal pain, and vaginal bleeding sometimes occurs. Having an ectopic pregnancy can be life-threatening. If this dangerous condition is not treated, it can lead to blood loss, shock, or even death. °What are the causes? °The most common cause of this condition is damage to one of the fallopian tubes. A fallopian tube may be narrowed or blocked, and that keeps the fertilized egg from reaching the uterus. °What increases the risk? °This condition is more likely to develop in women of childbearing age who have different levels of risk. The levels of risk can be divided into three categories. °High risk °· You have gone through infertility treatment. °· You have had an ectopic pregnancy before. °· You have had surgery on the fallopian tubes, or another surgical procedure, such as an abortion. °· You have had surgery to have the fallopian tubes tied (tubal ligation). °· You have problems or diseases of the fallopian tubes. °· You have been exposed to diethylstilbestrol (DES). This medicine was used until 1971, and it had effects on babies whose mothers took the medicine. °· You become pregnant while using an IUD (intrauterine device) for birth control. °Moderate risk °· You have a history of infertility. °· You have had an STI (sexually transmitted infection). °· You have a history of pelvic inflammatory  disease (PID). °· You have scarring from endometriosis. °· You have multiple sexual partners. °· You smoke. °Low risk °· You have had pelvic surgery. °· You use vaginal douches. °· You became sexually active before age 18. °What are the signs or symptoms? °Common symptoms of this condition include normal pregnancy symptoms, such as missing a period, nausea, tiredness, abdominal pain, breast tenderness, and bleeding. However, ectopic pregnancy will have additional symptoms, such as: °· Pain with intercourse. °· Irregular vaginal bleeding or spotting. °· Cramping or pain on one side or in the lower abdomen. °· Fast heartbeat, low blood pressure, and sweating. °· Passing out while having a bowel movement. °Symptoms of a ruptured ectopic pregnancy and internal bleeding may include: °· Sudden, severe pain in the abdomen and pelvis. °· Dizziness, weakness, light-headedness, or fainting. °· Pain in the shoulder or neck area. °How is this diagnosed? °This condition is diagnosed by: °· A pelvic exam to locate pain or a mass in the abdomen. °· A pregnancy test. This blood test checks for the presence as well as the specific level of pregnancy hormone in the bloodstream. °· Ultrasound. This is performed if a pregnancy test is positive. In this test, a probe is inserted into the vagina. The probe will detect a fetus, possibly in a location other than the uterus. °· Taking a sample of uterus tissue (dilation and curettage, or D&C). °· Surgery to perform a visual exam of the inside of the abdomen using a thin, lighted tube that has a tiny camera on the end (laparoscope). °· Culdocentesis. This procedure involves inserting a needle at the top of   the vagina, behind the uterus. If blood is present in this area, it may indicate that a fallopian tube is torn. How is this treated? This condition is treated with medicine or surgery. Medicine  An injection of a medicine (methotrexate) may be given to cause the pregnancy tissue to be  absorbed. This medicine may save your fallopian tube. It may be given if: ? The diagnosis is made early, with no signs of active bleeding. ? The fallopian tube has not ruptured. ? You are considered to be a good candidate for the medicine. Usually, pregnancy hormone blood levels are checked after methotrexate treatment. This is to be sure that the medicine is effective. It may take 4-6 weeks for the pregnancy to be absorbed. Most pregnancies will be absorbed by 3 weeks. Surgery  A laparoscope may be used to remove the pregnancy tissue.  If severe internal bleeding occurs, a larger cut (incision) may be made in the lower abdomen (laparotomy) to remove the fetus and placenta. This is done to stop the bleeding.  Part or all of the fallopian tube may be removed (salpingectomy) along with the fetus and placenta. The fallopian tube may also be repaired during the surgery.  In very rare circumstances, removal of the uterus (hysterectomy) may be required.  After surgery, pregnancy hormone testing may be done to be sure that there is no pregnancy tissue left. Whether your treatment is medicine or surgery, you may receive a Rho (D) immune globulin shot to prevent problems with any future pregnancy. This shot may be given if:  You are Rh-negative and the baby's father is Rh-positive.  You are Rh-negative and you do not know the Rh type of the baby's father. Follow these instructions at home:  Rest and limit your activity after the procedure for as long as told by your health care provider.  Until your health care provider says that it is safe: ? Do not lift anything that is heavier than 10 lb (4.5 kg), or the limit that your health care provider tells you. ? Avoid physical exercise and any movement that requires effort (is strenuous).  To help prevent constipation: ? Eat a healthy diet that includes fruits, vegetables, and whole grains. ? Drink 6-8 glasses of water per day. Get help right away  if:  You develop worsening pain that is not relieved by medicine.  You have: ? A fever or chills. ? Vaginal bleeding. ? Redness and swelling at the incision site. ? Nausea and vomiting.  You feel dizzy or weak.  You feel light-headed or you faint. This information is not intended to replace advice given to you by your health care provider. Make sure you discuss any questions you have with your health care provider. Document Revised: 01/16/2017 Document Reviewed: 09/05/2015 Elsevier Patient Education  2020 [employer]        Methotrexate Treatment for an Ectopic Pregnancy  Methotrexate is a medicine that treats an ectopic pregnancy. An ectopic pregnancy is a pregnancy in which the fetus develops outside the uterus. This kind of pregnancy can be dangerous. Methotrexate works by stopping the growth of the fertilized egg. It also helps your body absorb tissue from the egg. This takes between 2-6 weeks. Most ectopic pregnancies can be successfully treated with methotrexate if they are diagnosed early. Tell a health care provider about:  Any allergies you have.  All medicines you are taking, including vitamins, herbs, eye drops, creams, and over-the-counter medicines.  Any medical conditions you have. What are  the risks? Generally, this is a safe treatment. However, problems may occur, including:  Nausea or vomiting or both.  Vaginal bleeding or spotting.  Diarrhea.  Abdominal cramping.  Dizziness or feeling lightheaded.  Mouth sores.  Swelling or irritation of the lining of your lungs (pneumonitis).  Liver damage.  Hair loss. There is a risk that methotrexate treatment will fail and your pregnancy will continue. There is also a risk that the ectopic pregnancy might rupture while you are using this medicine. What happens before the procedure?  Liver tests, kidney tests, and a complete blood test will be done.  Blood tests will be done to measure the pregnancy  hormone levels and to determine your blood type.  If you are Rh-negative and the father is Rh-positive or his Rh type is not known, you will be given a Rho (D) immune globulin shot. What happens during the procedure? Your health care provider may give you methotrexate by injection or in the form of a pill. Methotrexate may be given as a single dose of medicine or a series of doses, depending on your response to the treatment.  Methotrexate injections will be given by your health care provider. This is the most common way that methotrexate is used to treat an ectopic pregnancy.  If you are prescribed oral methotrexate, it is very important that you follow your health care provider's instructions on how to take oral methotrexate. Additional medicines may be needed to manage an ectopic pregnancy. The procedure may vary among health care providers and hospitals. What happens after the procedure?  You may have abdominal cramping, vaginal bleeding, and fatigue.  Blood tests will be taken at timed intervals for several days or weeks to check your pregnancy hormone levels. The blood tests will be done until the pregnancy hormone can no longer be detected in the blood.  You may need to have a surgical procedure to remove the ectopic pregnancy if methotrexate treatment fails.  Follow instructions from your health care provider on how and when to report any symptoms that may indicate a ruptured ectopic pregnancy. Summary  Methotrexate is a medicine that treats an ectopic pregnancy.  Methotrexate may be given in a single dose or a series of doses over time.  Blood tests will be taken at timed intervals for several days or weeks to check your pregnancy hormone levels. The blood tests will be done until no more pregnancy hormone is detected in the blood.  There is a risk that methotrexate treatment will fail and your pregnancy will continue. There is also a risk that the ectopic pregnancy might rupture  while you are using this medicine. This information is not intended to replace advice given to you by your health care provider. Make sure you discuss any questions you have with your health care provider. Document Revised: 01/16/2017 Document Reviewed: 03/25/2016 Elsevier Patient Education  [employer].         Methotrexate Treatment for an Ectopic Pregnancy, Care After This sheet gives you information about how to care for yourself after your procedure. Your health care provider may also give you more specific instructions. If you have problems or questions, contact your health care provider. What can I expect after the procedure? After the procedure, it is common to have:  Abdominal cramping.  Vaginal bleeding.  Fatigue.  Nausea.  Vomiting.  Diarrhea. Blood tests will be taken at timed intervals for several days or weeks to check your pregnancy hormone levels. The blood tests will  be done until the pregnancy hormone can no longer be detected in the blood. Follow these instructions at home: Activity  Do not have sex until your health care provider approves.  Limit activities that take a lot of effort as told by your health care provider. Medicines  Take over the counter and prescription medicines only as told by your health care provider.  Do not take aspirin, ibuprofen, naproxen, or any other NSAIDs.  Do not take folic acid, prenatal vitamins, or other vitamins that contain folic acid. General instructions   Do not drink alcohol.  Follow instructions from your health care provider on how and when to report any symptoms that may indicate a ruptured ectopic pregnancy.  Keep all follow-up visits as told by your health care provider. This is important. Contact a health care provider if:  You have persistent nausea and vomiting.  You have persistent diarrhea.  You are having a reaction to the medicine, such as: ? Tiredness. ? Skin rash. ? Hair  loss. Get help right away if:  Your abdominal or pelvic pain gets worse.  You have more vaginal bleeding.  You feel light-headed or you faint.  You have shortness of breath.  Your heart rate increases.  You develop a cough.  You have chills.  You have a fever. Summary  After the procedure, it is common to have symptoms of abdominal cramping, vaginal bleeding and fatigue. You may also experience other symptoms.  Blood tests will be taken at timed intervals for several days or weeks to check your pregnancy hormone levels. The blood tests will be done until the pregnancy hormone can no longer be detected in the blood.  Limit strenuous activity as told by your health care provider.  Follow instructions from your health care provider on how and when to report any symptoms that may indicate a ruptured ectopic pregnancy. This information is not intended to replace advice given to you by your health care provider. Make sure you discuss any questions you have with your health care provider. Document Revised: 01/16/2017 Document Reviewed: 03/25/2016 Elsevier Patient Education  2020 Elsevier Inc.          Kidney Stones  Kidney stones are solid, rock-like deposits that form inside of the kidneys. The kidneys are a pair of organs that make urine. A kidney stone may form in a kidney and move into other parts of the urinary tract, including the tubes that connect the kidneys to the bladder (ureters), the bladder, and the tube that carries urine out of the body (urethra). As the stone moves through these areas, it can cause intense pain and block the flow of urine. Kidney stones are created when high levels of certain minerals are found in the urine. The stones are usually passed out of the body through urination, but in some cases, medical treatment may be needed to remove them. What are the causes? Kidney stones may be caused by:  A condition in which certain glands produce too much  parathyroid hormone (primary hyperparathyroidism), which causes too much calcium buildup in the blood.  A buildup of uric acid crystals in the bladder (hyperuricosuria). Uric acid is a chemical that the body produces when you eat certain foods. It usually exits the body in the urine.  Narrowing (stricture) of one or both of the ureters.  A kidney blockage that is present at birth (congenital obstruction).  Past surgery on the kidney or the ureters, such as gastric bypass surgery. What increases the risk?  The following factors may make you more likely to develop this condition:  Having had a kidney stone in the past.  Having a family history of kidney stones.  Not drinking enough water.  Eating a diet that is high in protein, salt (sodium), or sugar.  Being overweight or obese. What are the signs or symptoms? Symptoms of a kidney stone may include:  Pain in the side of the abdomen, right below the ribs (flank pain). Pain usually spreads (radiates) to the groin.  Needing to urinate frequently or urgently.  Painful urination.  Blood in the urine (hematuria).  Nausea.  Vomiting.  Fever and chills. How is this diagnosed? This condition may be diagnosed based on:  Your symptoms and medical history.  A physical exam.  Blood tests.  Urine tests. These may be done before and after the stone passes out of your body through urination.  Imaging tests, such as a CT scan, abdominal X-ray, or ultrasound.  A procedure to examine the inside of the bladder (cystoscopy). How is this treated? Treatment for kidney stones depends on the size, location, and makeup of the stones. Kidney stones will often pass out of the body through urination. You may need to:  Increase your fluid intake to help pass the stone. In some cases, you may be given fluids through an IV and may need to be monitored at the hospital.  Take medicine for pain.  Make changes in your diet to help prevent kidney  stones from coming back. Sometimes, medical procedures are needed to remove a kidney stone. This may involve:  A procedure to break up kidney stones using: ? A focused beam of light (laser therapy). ? Shock waves (extracorporeal shock wave lithotripsy).  Surgery to remove kidney stones. This may be needed if you have severe pain or have stones that block your urinary tract. Follow these instructions at home: Medicines  Take over-the-counter and prescription medicines only as told by your health care provider.  Ask your health care provider if the medicine prescribed to you requires you to avoid driving or using heavy machinery. Eating and drinking  Drink enough fluid to keep your urine pale yellow. You may be instructed to drink at least 8-10 glasses of water each day. This will help you pass the kidney stone.  If directed, change your diet. This may include: ? Limiting how much sodium you eat. ? Eating more fruits and vegetables. ? Limiting how much animal protein--such as red meat, poultry, fish, and eggs--you eat.  Follow instructions from your health care provider about eating or drinking restrictions. General instructions  Collect urine samples as told by your health care provider. You may need to collect a urine sample: ? 24 hours after you pass the stone. ? 8-12 weeks after passing the kidney stone, and every 6-12 months after that.  Strain your urine every time you urinate, for as long as directed. Use the strainer that your health care provider recommends.  Do not throw out the kidney stone after passing it. Keep the stone so it can be tested by your health care provider. Testing the makeup of your kidney stone may help prevent you from getting kidney stones in the future.  Keep all follow-up visits as told by your health care provider. This is important. You may need follow-up X-rays or ultrasounds to make sure that your stone has passed. How is this prevented? To  prevent another kidney stone:  Drink enough fluid to keep your urine pale  yellow. This is the best way to prevent kidney stones.  Eat a healthy diet and follow recommendations from your health care provider about foods to avoid. You may be instructed to eat a low-protein diet. Recommendations vary depending on the type of kidney stone that you have.  Maintain a healthy weight. Where to find more information  National Kidney Foundation (NKF): www.kidney.org  [facility]): www.urologyhealth.org Contact a health care provider if:  You have pain that gets worse or does not get better with medicine. Get help right away if:  You have a fever or chills.  You develop severe pain.  You develop new abdominal pain.  You faint.  You are unable to urinate. Summary  Kidney stones are solid, rock-like deposits that form inside of the kidneys.  Kidney stones can cause nausea, vomiting, blood in the urine, abdominal pain, and the urge to urinate frequently.  Treatment for kidney stones depends on the size, location, and makeup of the stones. Kidney stones will often pass out of the body through urination.  Kidney stones can be prevented by drinking enough fluids, eating a healthy diet, and maintaining a healthy weight. This information is not intended to replace advice given to you by your health care provider. Make sure you discuss any questions you have with your health care provider. Document Revised: 06/22/2018 Document Reviewed: 06/22/2018 Elsevier Patient Education  2020 [employer].

## 2019-06-26 LAB — CULTURE, OB URINE: Culture: NO GROWTH

## 2019-06-27 ENCOUNTER — Other Ambulatory Visit: Payer: 59

## 2019-06-30 ENCOUNTER — Encounter: Payer: Self-pay | Admitting: *Deleted

## 2019-06-30 ENCOUNTER — Other Ambulatory Visit: Payer: Self-pay | Admitting: *Deleted

## 2019-06-30 DIAGNOSIS — O00201 Right ovarian pregnancy without intrauterine pregnancy: Secondary | ICD-10-CM

## 2019-07-01 ENCOUNTER — Other Ambulatory Visit: Payer: 59

## 2019-07-01 ENCOUNTER — Encounter: Payer: Self-pay | Admitting: Family

## 2019-07-01 ENCOUNTER — Other Ambulatory Visit: Payer: Self-pay

## 2019-07-01 DIAGNOSIS — O00201 Right ovarian pregnancy without intrauterine pregnancy: Secondary | ICD-10-CM

## 2019-07-01 NOTE — Progress Notes (Signed)
Patient presented to office today for a non-stat hcg level check. Patient does not have any concerns she would like to discuss at this time. Advised patient we will follow up with a phone call when results come back.Patient voice understanding.

## 2019-07-02 LAB — BETA HCG QUANT (REF LAB): hCG Quant: 49 m[IU]/mL

## 2019-07-06 ENCOUNTER — Other Ambulatory Visit: Payer: Self-pay

## 2019-07-06 ENCOUNTER — Ambulatory Visit: Payer: 59 | Admitting: Family

## 2019-07-06 ENCOUNTER — Encounter: Payer: Self-pay | Admitting: Family

## 2019-07-06 VITALS — BP 102/70 | HR 102 | Temp 98.2°F | Ht 63.0 in | Wt 113.4 lb

## 2019-07-06 DIAGNOSIS — O009 Unspecified ectopic pregnancy without intrauterine pregnancy: Secondary | ICD-10-CM

## 2019-07-06 DIAGNOSIS — N200 Calculus of kidney: Secondary | ICD-10-CM | POA: Diagnosis not present

## 2019-07-06 DIAGNOSIS — Z0100 Encounter for examination of eyes and vision without abnormal findings: Secondary | ICD-10-CM

## 2019-07-06 NOTE — Progress Notes (Signed)
Emily Lin is a 29 y.o. female with the following history as recorded in [employer]:  Patient Active Problem List   Diagnosis Date Noted  . Kidney stone 06/24/2019    Current Outpatient Medications  Medication Sig Dispense Refill  . acetaminophen (TYLENOL) 325 MG tablet Take 650 mg by mouth every 6 (six) hours as needed for mild pain or headache.     No current facility-administered medications for this visit.    Allergies: Patient has no known allergies.  Past Medical History:  Diagnosis Date  . Chronic kidney disease    Hx Kidney Stones    Past Surgical History:  Procedure Laterality Date  . APPENDECTOMY    . LITHOTRIPSY    . WISDOM TOOTH EXTRACTION Bilateral     Family History  Problem Relation Age of Onset  . Breast cancer Maternal Grandmother   . Healthy Mother   . Healthy Father     Social History   Tobacco Use  . Smoking status: Never Smoker  . Smokeless tobacco: Never Used  Substance Use Topics  . Alcohol use: Not Currently    Comment: Socially    Subjective:  Presents today as a new patient; has been having complications from ectopic pregnancy since early May; is having weekly hcg draws by GYN to monitor; feels overall is coping well; does plan to establish with GYN;  Requesting referral to urology to discuss recurrent kidney stones; has had problems with kidney stones since she was 29 yo;    Objective:  Vitals:   07/06/19 1008  BP: 102/70  Pulse: (!) 102  Temp: 98.2 F (36.8 C)  TempSrc: Oral  SpO2: 99%  Weight: 113 lb 6.4 oz (51.4 kg)  Height: 5\' 3"  (1.6 m)    General: Well developed, well nourished, in no acute distress  Skin : Warm and dry.  Head: Normocephalic and atraumatic  Lungs: Respirations unlabored; clear to auscultation bilaterally without wheeze, rales, rhonchi  CVS exam: normal rate and regular rhythm.  Neurologic: Alert and oriented; speech intact; face symmetrical; moves all extremities well; CNII-XII intact without focal deficit    Assessment:  1. Recurrent kidney stones   2. Encounter for complete eye exam   3. Ectopic pregnancy, unspecified location, unspecified whether intrauterine pregnancy present     Plan:  1. Refer to urology as requested; 2. Refer to ophthalmology as requested; 3. Continue to monitor with GYN and she will plan for routine OB-GYN care with specialist.  This visit occurred during the SARS-CoV-2 public health emergency.  Safety protocols were in place, including screening questions prior to the visit, additional usage of staff PPE, and extensive cleaning of exam room while observing appropriate contact time as indicated for disinfecting solutions.     No follow-ups on file.  Orders Placed This Encounter  Procedures  . Ambulatory referral to Urology    Referral Priority:   Routine    Referral Type:   Consultation    Referral Reason:   Specialty Services Required    Requested Specialty:   Urology    Number of Visits Requested:   1  . Ambulatory referral to Ophthalmology    Referral Priority:   Routine    Referral Type:   Consultation    Referral Reason:   Specialty Services Required    Requested Specialty:   Ophthalmology    Number of Visits Requested:   1    Requested Prescriptions    No prescriptions requested or ordered in this encounter

## 2019-07-07 ENCOUNTER — Other Ambulatory Visit: Payer: 59

## 2019-07-07 DIAGNOSIS — O00201 Right ovarian pregnancy without intrauterine pregnancy: Secondary | ICD-10-CM

## 2019-07-08 LAB — BETA HCG QUANT (REF LAB): hCG Quant: 13 m[IU]/mL

## 2019-07-14 ENCOUNTER — Other Ambulatory Visit: Payer: Self-pay

## 2019-07-14 ENCOUNTER — Other Ambulatory Visit: Payer: 59

## 2019-07-14 DIAGNOSIS — O00101 Right tubal pregnancy without intrauterine pregnancy: Secondary | ICD-10-CM

## 2019-07-15 ENCOUNTER — Telehealth: Payer: Self-pay | Admitting: *Deleted

## 2019-07-15 LAB — BETA HCG QUANT (REF LAB): hCG Quant: 2 m[IU]/mL

## 2019-07-15 NOTE — Telephone Encounter (Signed)
Pt informed of results and recommendations to make an appt with one of our providers. Appt made in July

## 2019-07-15 NOTE — Telephone Encounter (Signed)
-----   Message from Tereso Newcomer, MD sent at 07/15/2019  8:09 AM EDT ----- Resolved ectopic pregnancy.  Patient needs appointment to discuss contraception/future conception plans. Please call to inform patient of results and recommendations. Results were released to MyChart and patient was given recommendations as indicated.

## 2019-08-29 ENCOUNTER — Encounter: Payer: Self-pay | Admitting: Family Medicine

## 2019-08-29 ENCOUNTER — Ambulatory Visit (INDEPENDENT_AMBULATORY_CARE_PROVIDER_SITE_OTHER): Payer: 59 | Admitting: Family Medicine

## 2019-08-29 ENCOUNTER — Other Ambulatory Visit: Payer: Self-pay

## 2019-08-29 VITALS — BP 110/76 | HR 56 | Wt 113.6 lb

## 2019-08-29 DIAGNOSIS — Z01419 Encounter for gynecological examination (general) (routine) without abnormal findings: Secondary | ICD-10-CM

## 2019-08-29 LAB — CBC
Hematocrit: 42.6 % (ref 34.0–46.6)
Hemoglobin: 14.7 g/dL (ref 11.1–15.9)
MCH: 31.3 pg (ref 26.6–33.0)
MCHC: 34.5 g/dL (ref 31.5–35.7)
MCV: 91 fL (ref 79–97)
Platelets: 328 10*3/uL (ref 150–450)
RBC: 4.7 x10E6/uL (ref 3.77–5.28)
RDW: 11.5 % — ABNORMAL LOW (ref 11.7–15.4)
WBC: 7 10*3/uL (ref 3.4–10.8)

## 2019-08-29 NOTE — Patient Instructions (Signed)
 Preventive Care 21-27 Years Old, Female Preventive care refers to visits with your health care provider and lifestyle choices that can promote health and wellness. This includes:  A yearly physical exam. This may also be called an annual well check.  Regular dental visits and eye exams.  Immunizations.  Screening for certain conditions.  Healthy lifestyle choices, such as eating a healthy diet, getting regular exercise, not using drugs or products that contain nicotine and tobacco, and limiting alcohol use. What can I expect for my preventive care visit? Physical exam Your health care provider will check your:  Height and weight. This may be used to calculate body mass index (BMI), which tells if you are at a healthy weight.  Heart rate and blood pressure.  Skin for abnormal spots. Counseling Your health care provider may ask you questions about your:  Alcohol, tobacco, and drug use.  Emotional well-being.  Home and relationship well-being.  Sexual activity.  Eating habits.  Work and work environment.  Method of birth control.  Menstrual cycle.  Pregnancy history. What immunizations do I need?  Influenza (flu) vaccine  This is recommended every year. Tetanus, diphtheria, and pertussis (Tdap) vaccine  You may need a Td booster every 10 years. Varicella (chickenpox) vaccine  You may need this if you have not been vaccinated. Human papillomavirus (HPV) vaccine  If recommended by your health care provider, you may need three doses over 6 months. Measles, mumps, and rubella (MMR) vaccine  You may need at least one dose of MMR. You may also need a second dose. Meningococcal conjugate (MenACWY) vaccine  One dose is recommended if you are age 19-21 years and a first-year college student living in a residence hall, or if you have one of several medical conditions. You may also need additional booster doses. Pneumococcal conjugate (PCV13) vaccine  You may need  this if you have certain conditions and were not previously vaccinated. Pneumococcal polysaccharide (PPSV23) vaccine  You may need one or two doses if you smoke cigarettes or if you have certain conditions. Hepatitis A vaccine  You may need this if you have certain conditions or if you travel or work in places where you may be exposed to hepatitis A. Hepatitis B vaccine  You may need this if you have certain conditions or if you travel or work in places where you may be exposed to hepatitis B. Haemophilus influenzae type b (Hib) vaccine  You may need this if you have certain conditions. You may receive vaccines as individual doses or as more than one vaccine together in one shot (combination vaccines). Talk with your health care provider about the risks and benefits of combination vaccines. What tests do I need?  Blood tests  Lipid and cholesterol levels. These may be checked every 5 years starting at age 20.  Hepatitis C test.  Hepatitis B test. Screening  Diabetes screening. This is done by checking your blood sugar (glucose) after you have not eaten for a while (fasting).  Sexually transmitted disease (STD) testing.  BRCA-related cancer screening. This may be done if you have a family history of breast, ovarian, tubal, or peritoneal cancers.  Pelvic exam and Pap test. This may be done every 3 years starting at age 21. Starting at age 30, this may be done every 5 years if you have a Pap test in combination with an HPV test. Talk with your health care provider about your test results, treatment options, and if necessary, the need for more   tests. Follow these instructions at home: Eating and drinking   Eat a diet that includes fresh fruits and vegetables, whole grains, lean protein, and low-fat dairy.  Take vitamin and mineral supplements as recommended by your health care provider.  Do not drink alcohol if: ? Your health care provider tells you not to drink. ? You are  pregnant, may be pregnant, or are planning to become pregnant.  If you drink alcohol: ? Limit how much you have to 0-1 drink a day. ? Be aware of how much alcohol is in your drink. In the [place] one drink equals one 12 oz bottle of beer (355 mL), one 5 oz glass of wine (148 mL), or one 1 oz glass of hard liquor (44 mL). Lifestyle  Take daily care of your teeth and gums.  Stay active. Exercise for at least 30 minutes on 5 or more days each week.  Do not use any products that contain nicotine or tobacco, such as cigarettes, e-cigarettes, and chewing tobacco. If you need help quitting, ask your health care provider.  If you are sexually active, practice safe sex. Use a condom or other form of birth control (contraception) in order to prevent pregnancy and STIs (sexually transmitted infections). If you plan to become pregnant, see your health care provider for a preconception visit. What's next?  Visit your health care provider once a year for a well check visit.  Ask your health care provider how often you should have your eyes and teeth checked.  Stay up to date on all vaccines. This information is not intended to replace advice given to you by your health care provider. Make sure you discuss any questions you have with your health care provider. Document Revised: 10/15/2017 Document Reviewed: 10/15/2017 Elsevier Patient Education  2020 Elsevier Inc.  

## 2019-08-29 NOTE — Progress Notes (Signed)
  Subjective:     Emily Lin is a 29 y.o. female and is here for a comprehensive physical exam. The patient reports no problems. Prior on LoLoestrin, which felt like too much. Seemed overly emotional at the time. Using condoms. Cycles are monthly and last 6 days. Mild dysmenorrhea. Has h/o ectopic earlier this year, treated with MTX. Has remote h/o chlamydia. Does not want a pregnancy right now.  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  The following portions of the patient's history were reviewed and updated as appropriate: allergies, current medications, past family history, past medical history, past social history, past surgical history and problem list.  Review of Systems Pertinent items noted in HPI and remainder of comprehensive ROS otherwise negative.   Objective:    BP 110/76   Pulse (!) 56   Wt 113 lb 9.6 oz (51.5 kg)   LMP 08/09/2019   BMI 20.12 kg/m  General appearance: alert, cooperative and appears stated age Head: Normocephalic, without obvious abnormality, atraumatic Neck: no adenopathy, supple, symmetrical, trachea midline and thyroid not enlarged, symmetric, no tenderness/mass/nodules Lungs: clear to auscultation bilaterally Breasts: normal appearance, no masses or tenderness, right breast with 2-3 cm fibrocystic, mobile, area at 11 o'clock Heart: regular rate and rhythm, S1, S2 normal, no murmur, click, rub or gallop Abdomen: soft, non-tender; bowel sounds normal; no masses,  no organomegaly Pelvic: cervix normal in appearance, external genitalia normal, no adnexal masses or tenderness, no cervical motion tenderness, uterus normal size, shape, and consistency and vagina normal without discharge Extremities: extremities normal, atraumatic, no cyanosis or edema Pulses: 2+ and symmetric Skin: Skin color, texture, turgor normal. No rashes or lesions Lymph nodes: Cervical, supraclavicular, and axillary nodes normal. Neurologic: Grossly normal    Assessment:     Healthy female exam.      Plan:     Encounter for gynecological examination without abnormal finding - Plan: TSH, CBC, Comprehensive metabolic panel, VITAMIN D 25 Hydroxy (Vit-D Deficiency, Fractures), Lipid panel  Return in 1 year (on 08/28/2020).  See After Visit Summary for Counseling Recommendations

## 2019-08-29 NOTE — Progress Notes (Signed)
Last pap 10/13/2017-normal  No sti testing

## 2019-08-30 ENCOUNTER — Telehealth: Payer: Self-pay

## 2019-08-30 LAB — COMPREHENSIVE METABOLIC PANEL
ALT: 16 IU/L (ref 0–32)
AST: 20 IU/L (ref 0–40)
Albumin/Globulin Ratio: 1.8 (ref 1.2–2.2)
Albumin: 4.8 g/dL (ref 3.9–5.0)
Alkaline Phosphatase: 49 IU/L (ref 48–121)
BUN/Creatinine Ratio: 20 (ref 9–23)
BUN: 13 mg/dL (ref 6–20)
Bilirubin Total: 0.3 mg/dL (ref 0.0–1.2)
CO2: 26 mmol/L (ref 20–29)
Calcium: 10.3 mg/dL — ABNORMAL HIGH (ref 8.7–10.2)
Chloride: 101 mmol/L (ref 96–106)
Creatinine, Ser: 0.64 mg/dL (ref 0.57–1.00)
GFR calc Af Amer: 141 mL/min/{1.73_m2} (ref >59)
GFR calc non Af Amer: 123 mL/min/{1.73_m2} (ref >59)
Globulin, Total: 2.7 g/dL (ref 1.5–4.5)
Glucose: 73 mg/dL (ref 65–99)
Potassium: 4.3 mmol/L (ref 3.5–5.2)
Sodium: 139 mmol/L (ref 134–144)
Total Protein: 7.5 g/dL (ref 6.0–8.5)

## 2019-08-30 LAB — LIPID PANEL
Chol/HDL Ratio: 2.3 ratio (ref 0.0–4.4)
Cholesterol, Total: 193 mg/dL (ref 100–199)
HDL: 83 mg/dL (ref >39)
LDL Chol Calc (NIH): 94 mg/dL (ref 0–99)
Triglycerides: 88 mg/dL (ref 0–149)
VLDL Cholesterol Cal: 16 mg/dL (ref 5–40)

## 2019-08-30 LAB — TSH: TSH: 4.08 u[IU]/mL (ref 0.450–4.500)

## 2019-08-30 LAB — VITAMIN D 25 HYDROXY (VIT D DEFICIENCY, FRACTURES): Vit D, 25-Hydroxy: 31.8 ng/mL (ref 30.0–100.0)

## 2019-08-30 NOTE — Telephone Encounter (Signed)
-----   Message from Reva Bores, MD sent at 08/30/2019  7:51 AM EDT ----- Book for repeat CMP in 6 wks, lab visit only

## 2019-08-30 NOTE — Telephone Encounter (Signed)
Patient has been informed of her lab results and the need to repeat her CMP in six weeks. Message sent to [facility] to scheduled follow up on labs.

## 2019-10-10 ENCOUNTER — Other Ambulatory Visit: Payer: 59

## 2020-05-15 IMAGING — US US OB < 14 WEEKS - US OB TV
1 series · 15 of 28 positions shown · non-contrast
Comparison: None.

CLINICAL DATA: Positive pregnancy test with right-sided abdominal
pain and vaginal bleeding

EXAM:
OBSTETRIC <14 WK US AND TRANSVAGINAL OB US
TECHNIQUE: Both transabdominal and transvaginal ultrasound examinations were
performed for complete evaluation of the gestation as well as the
maternal uterus, adnexal regions, and pelvic cul-de-sac.
Transvaginal technique was performed to assess early pregnancy.

[Series 1: us ob < 14 weeks - us ob tv · 80 acquisitions, 15 frames shown]
[im 1/80]
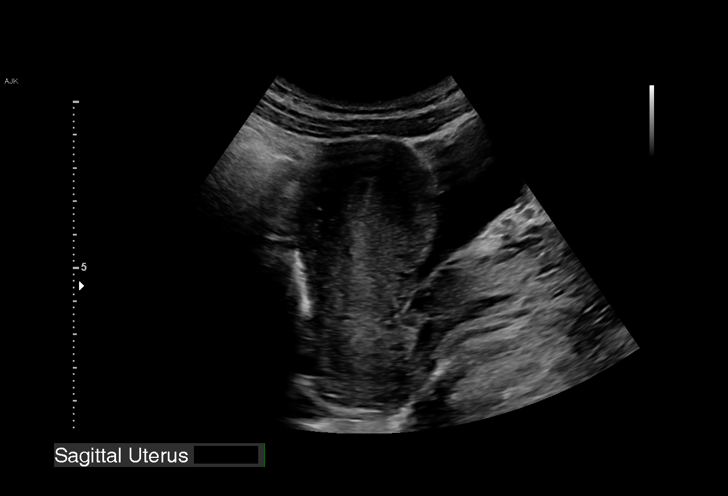
[im 6/80]
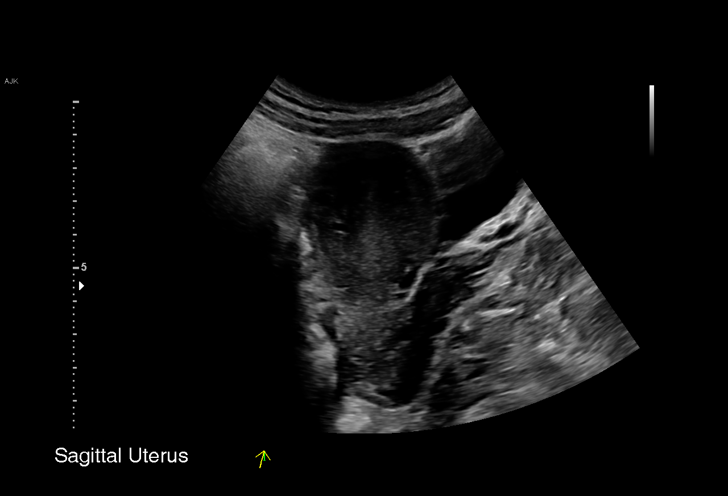
[im 12/80]
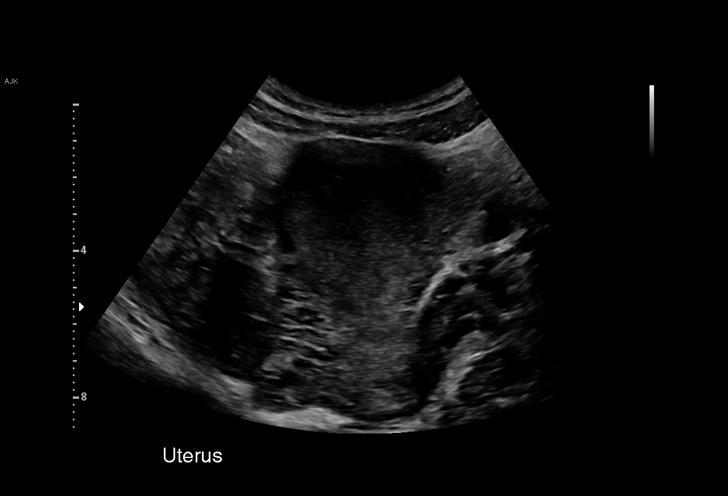
[im 18/80]
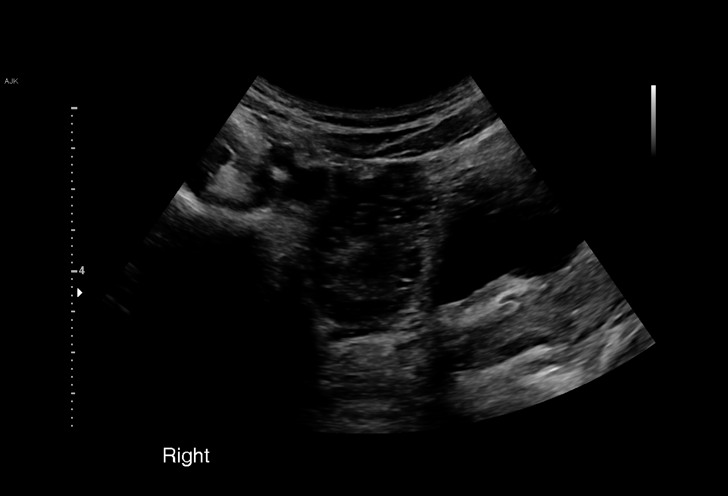
[im 24/80]
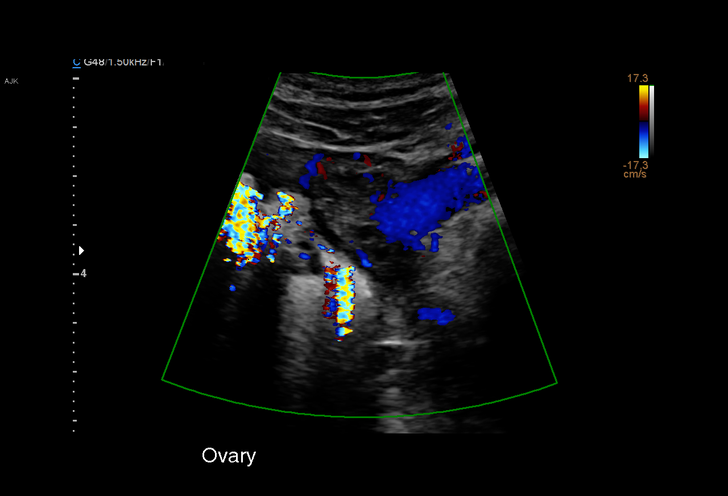
[im 30/80]
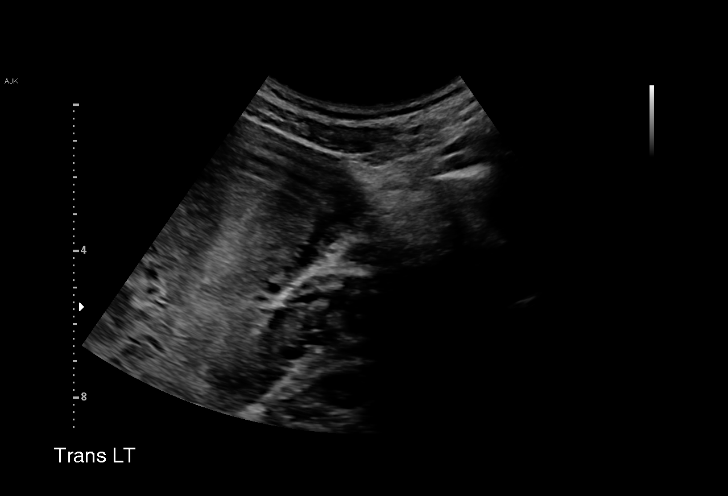
[im 36/80]
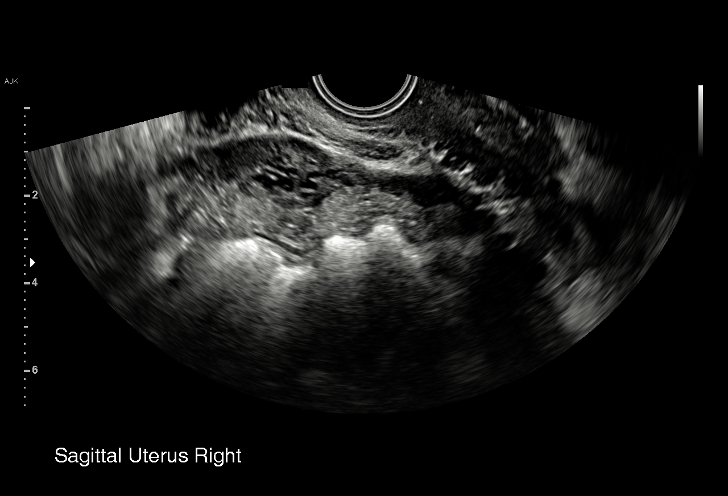
[im 41/80]
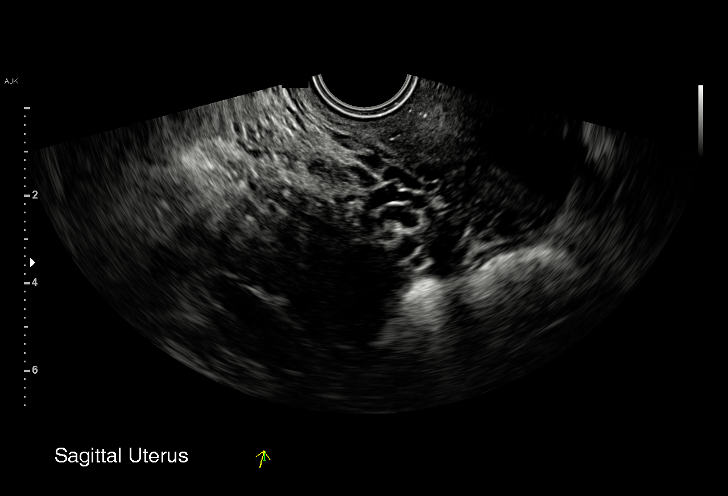
[im 44/80]
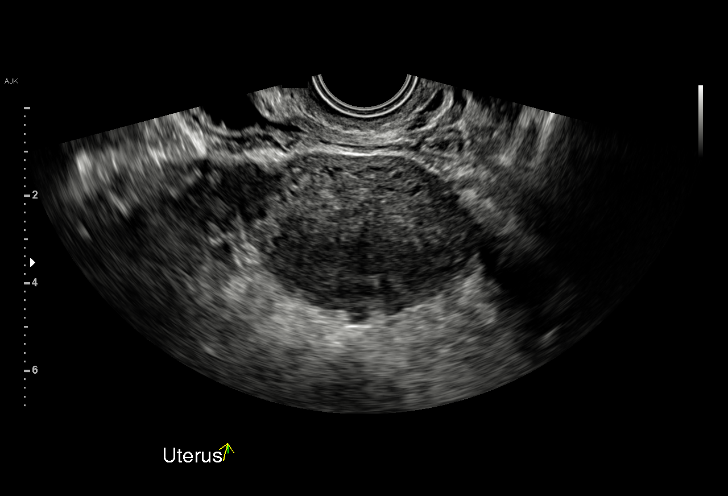
[im 50/80]
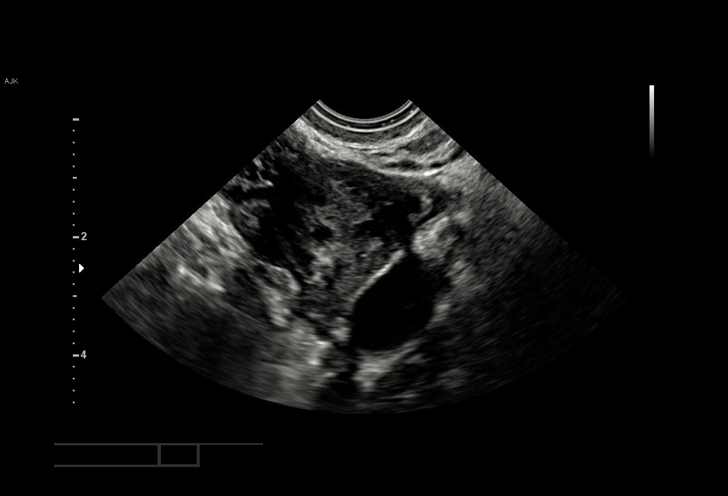
[im 56/80]
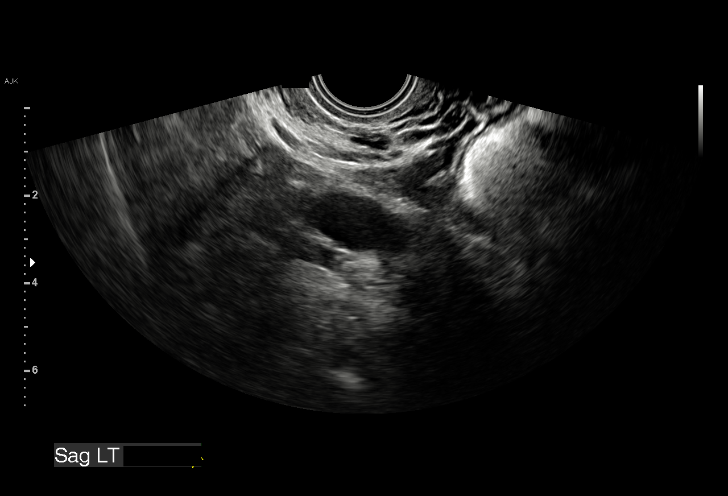
[im 62/80]
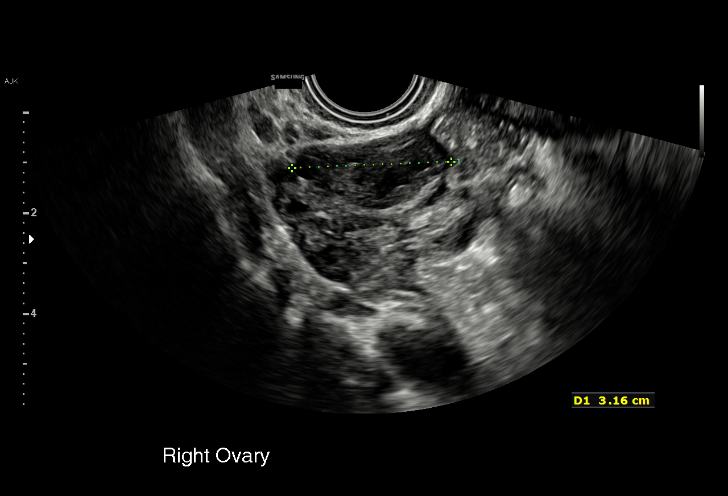
[im 68/80]
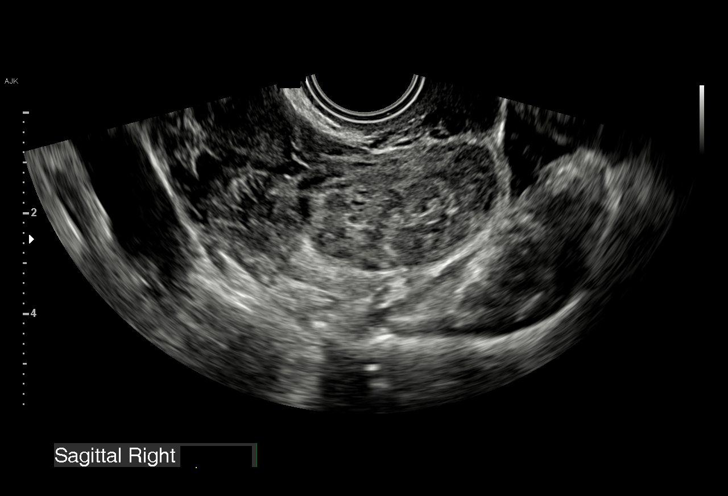
[im 74/80]
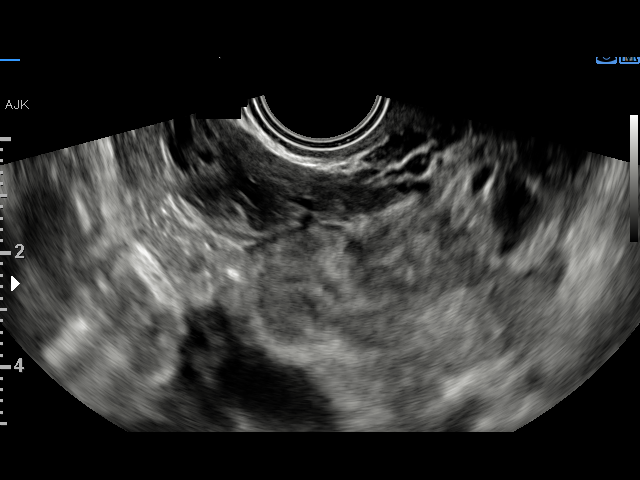
[im 80/80]
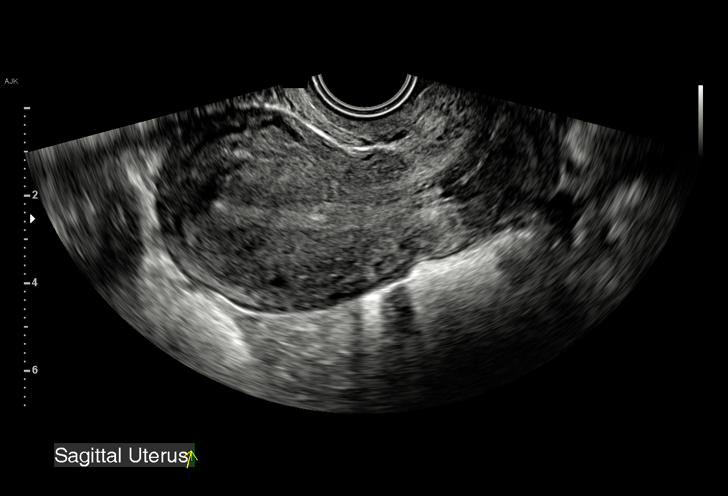

[15 of 28 positions shown; findings below may reference images not displayed]

FINDINGS: Intrauterine gestational sac: Absent

Maternal uterus/adnexae: Uterus is well visualized and within normal
limits. Left adnexa is unremarkable. Right adnexa demonstrates a
mildly echogenic mass lesion separate from the right ovary which
measures approximately 4.0 x 2.4 x 3.2 cm with increased blood flow
peripherally suspicious for ectopic pregnancy.
IMPRESSION: Findings in the right adnexa suspicious for right ectopic pregnancy
adjacent to the right ovary.

No intrauterine gestation is noted.

Critical Value/emergent results were called by telephone at the time
of interpretation on 06/13/2019 at [DATE] to Dr. SORIN OXENDINE ,
who verbally acknowledged these results.

## 2020-05-26 IMAGING — US US RENAL
1 series · 15 of 25 positions shown · non-contrast
Comparison: None.

CLINICAL DATA: Right-sided abdominal pain. The patient has a known
right ectopic pregnancy. History of kidney stones.

EXAM:
RENAL / URINARY TRACT ULTRASOUND COMPLETE

[Series 1: us renal · 15 of 31 slices shown]
[im 1/31]
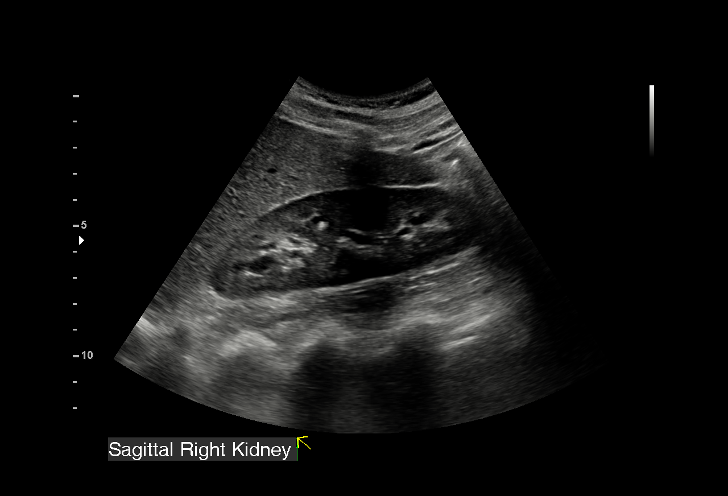
[im 3/31]
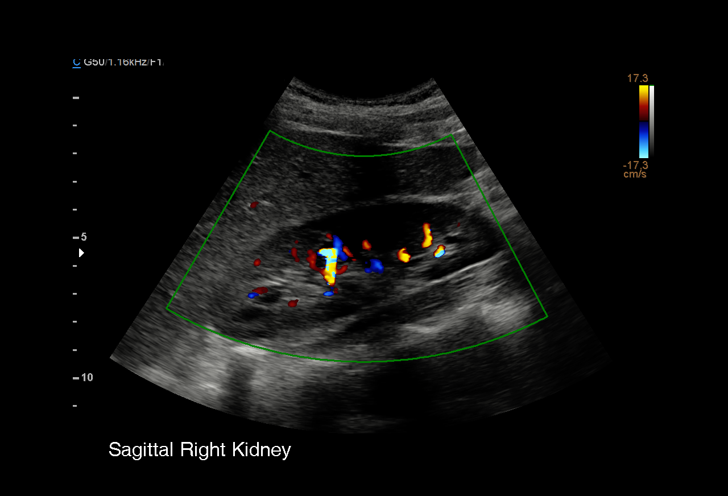
[im 6/31]
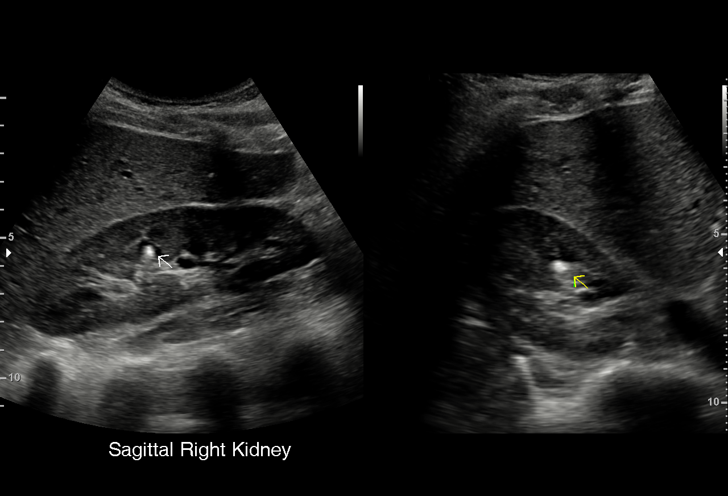
[im 7/31]
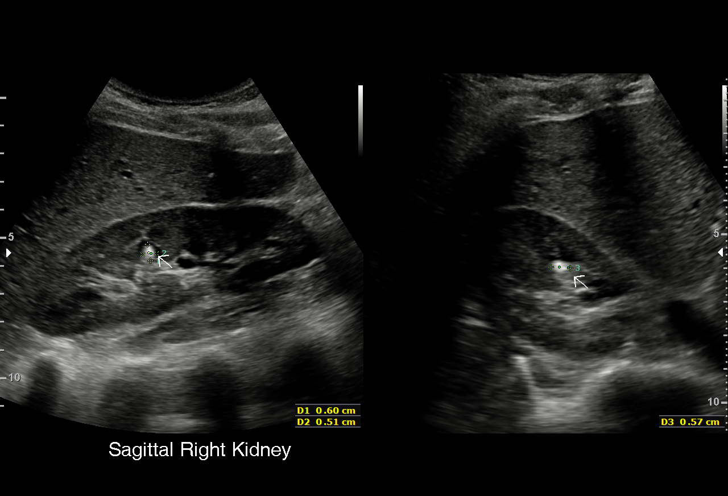
[im 9/31]
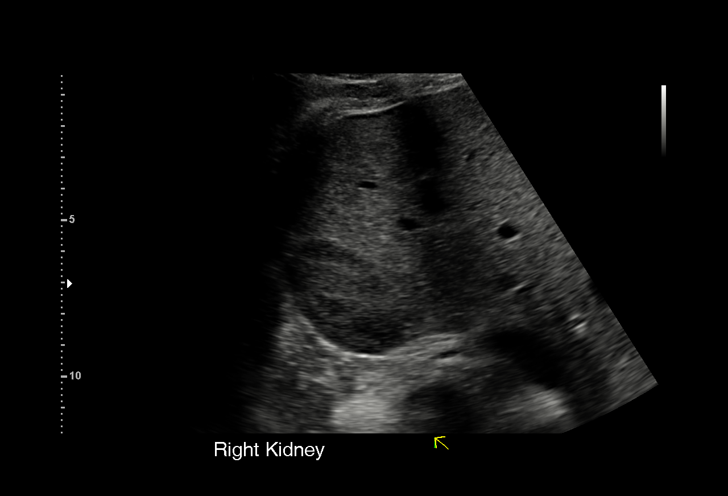
[im 12/31]
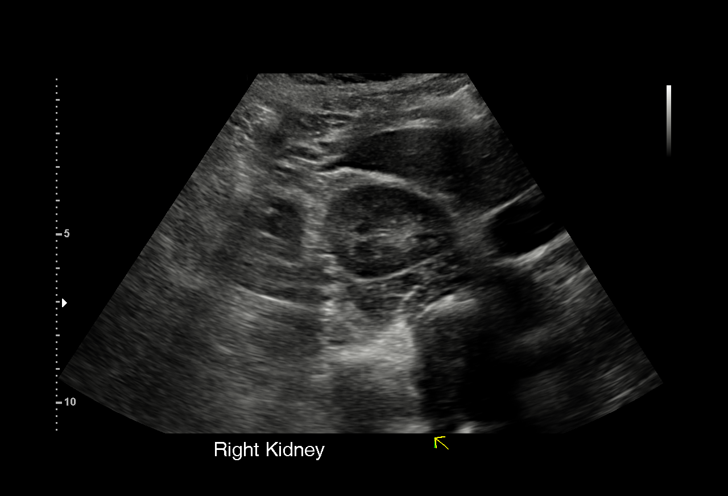
[im 13/31]
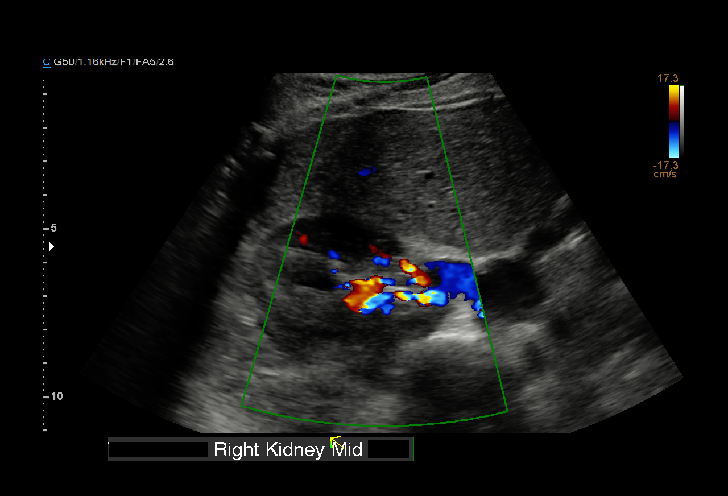
[im 16/31]
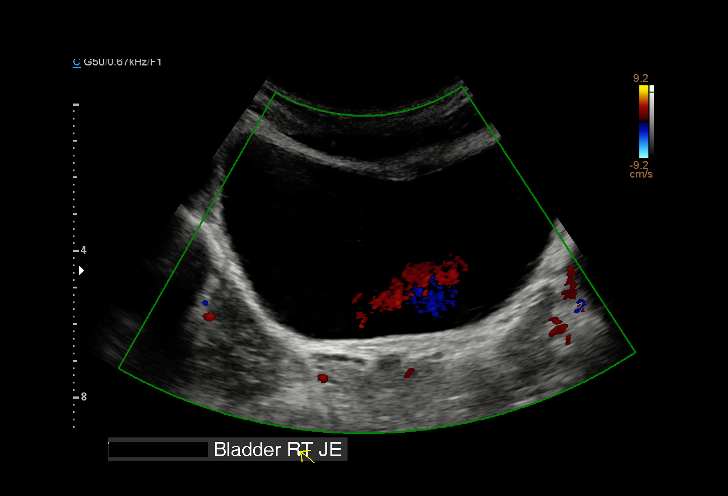
[im 18/31]
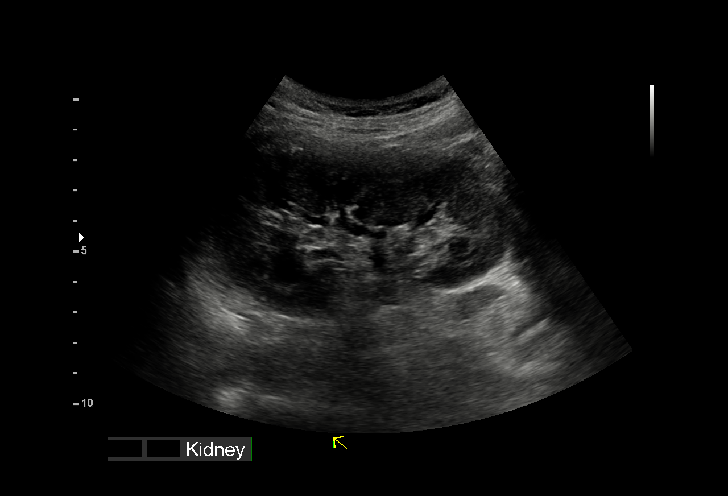
[im 19/31]
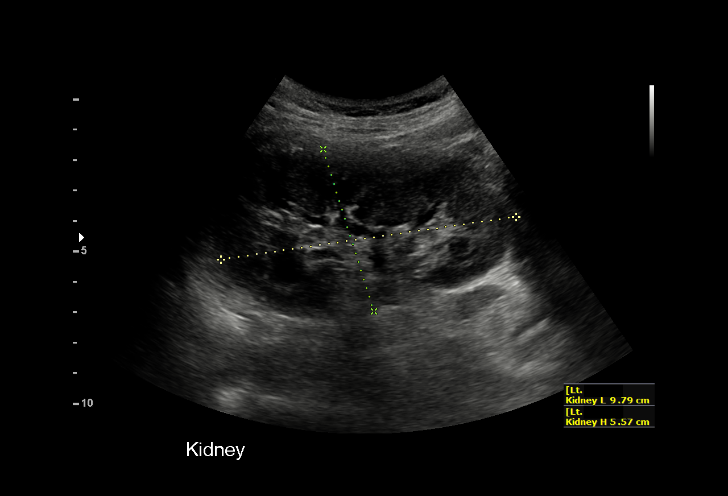
[im 22/31]
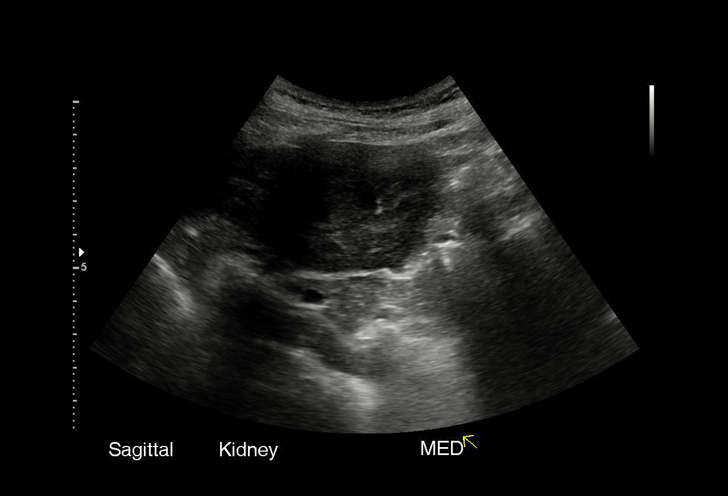
[im 24/31]
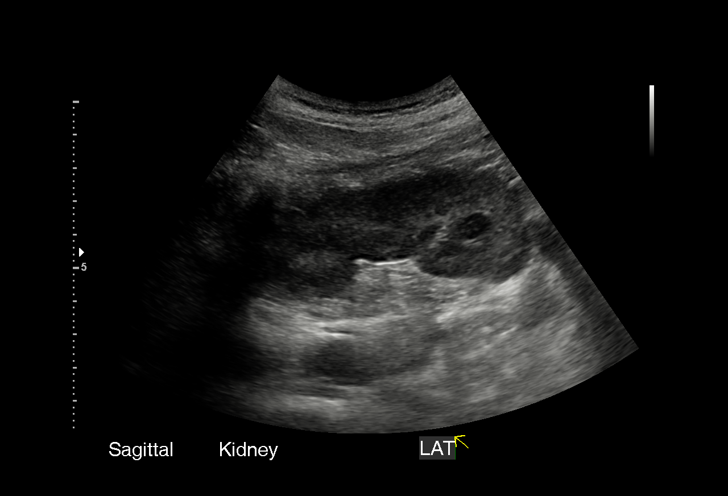
[im 26/31]
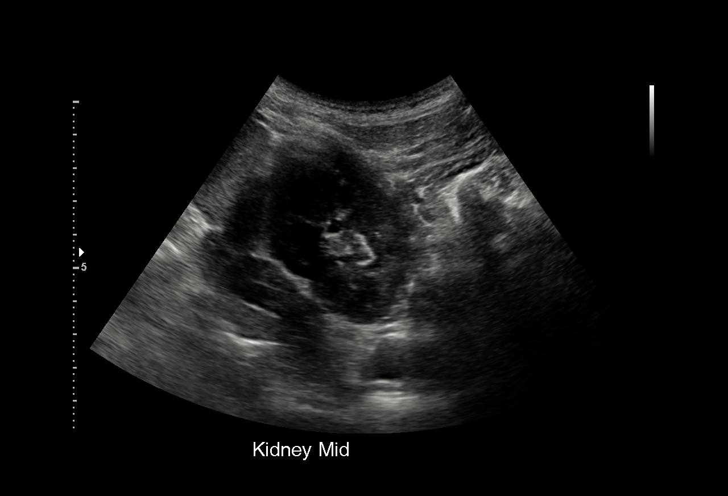
[im 28/31]
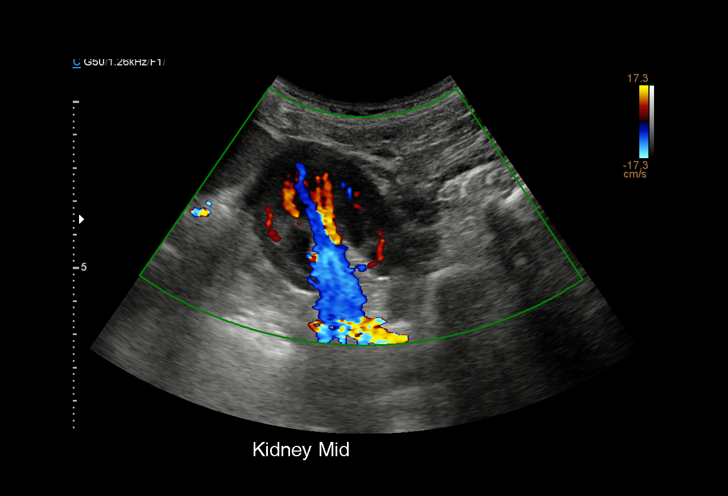
[im 31/31]
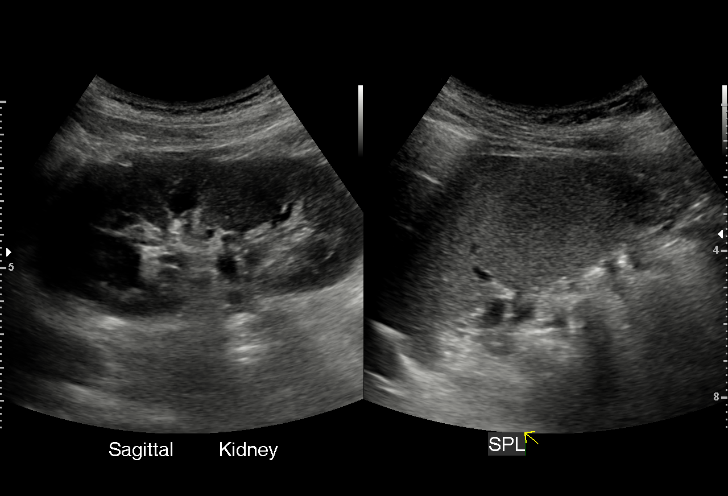

[15 of 25 positions shown; findings below may reference images not displayed]

FINDINGS: Right Kidney:

Renal measurements: 10.8 x 3.7 x 5 cm = volume: 102 mL. There is no
hydronephrosis. There is a 6 mm nonobstructing stone in the
interpolar region of the right kidney.

Left Kidney:

Renal measurements: 9.8 x 5.6 x 4.5 cm = volume: 128 mL.
Echogenicity within normal limits. No mass or hydronephrosis
visualized.

Bladder:

Appears normal for degree of bladder distention. Both ureteral jets
were visualized.

Other:

None.
IMPRESSION: 1. No acute abnormality.  No hydronephrosis.
2. There is a nonobstructing 6 mm stone in the interpolar region of
the right kidney.

## 2020-05-26 IMAGING — US US OB TRANSVAGINAL
1 series · 15 of 28 positions shown · non-contrast
Comparison: 06/13/2018

CLINICAL DATA: Known ectopic with sudden onset of pain

EXAM:
TRANSVAGINAL OB ULTRASOUND
TECHNIQUE: Transvaginal ultrasound was performed for complete evaluation of the
gestation as well as the maternal uterus, adnexal regions, and
pelvic cul-de-sac.

[Series 1: us ob transvaginal · 34 acquisitions, 15 frames shown]
[im 1/34]
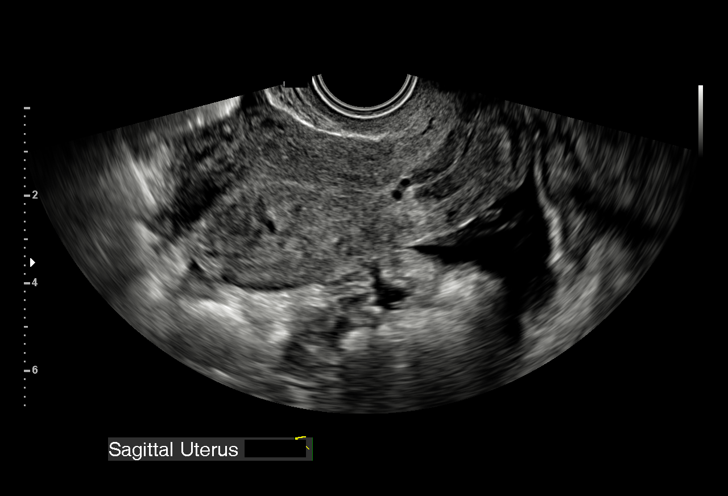
[im 3/34]
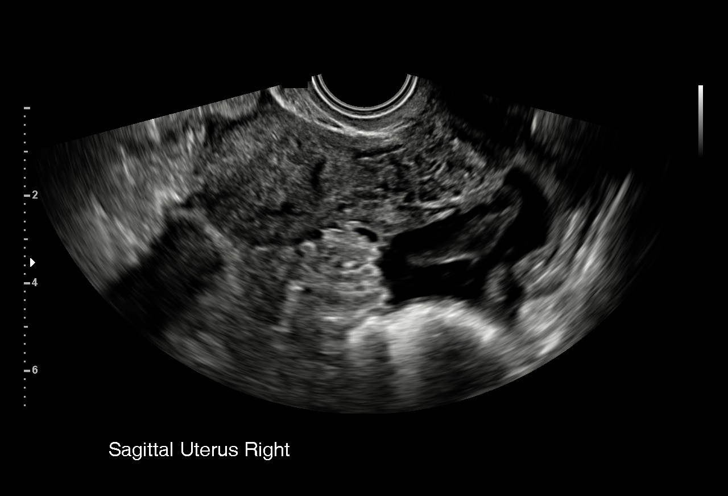
[im 5/34]
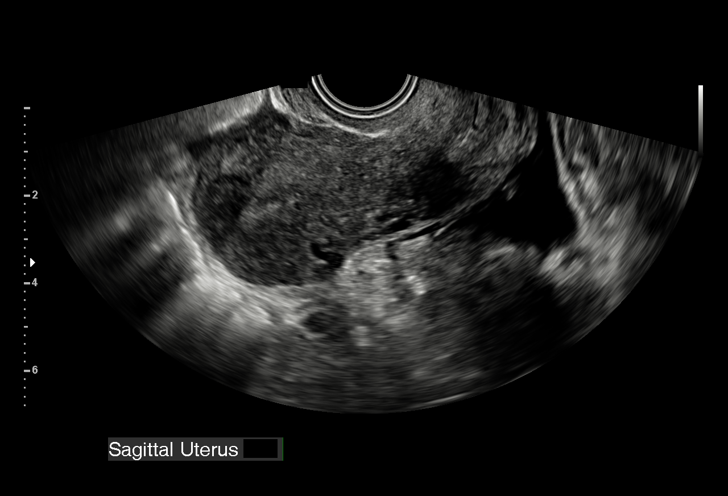
[im 8/34]
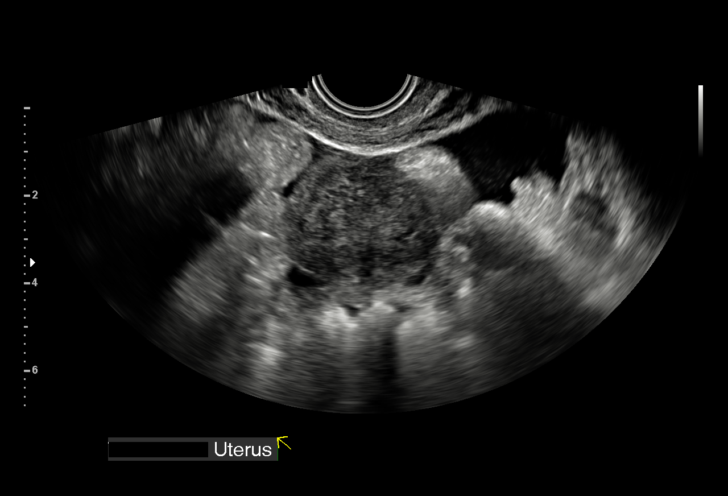
[im 10/34]
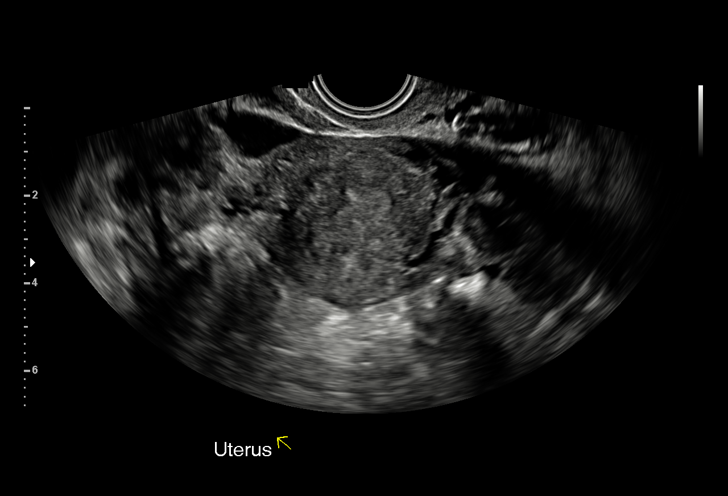
[im 13/34]
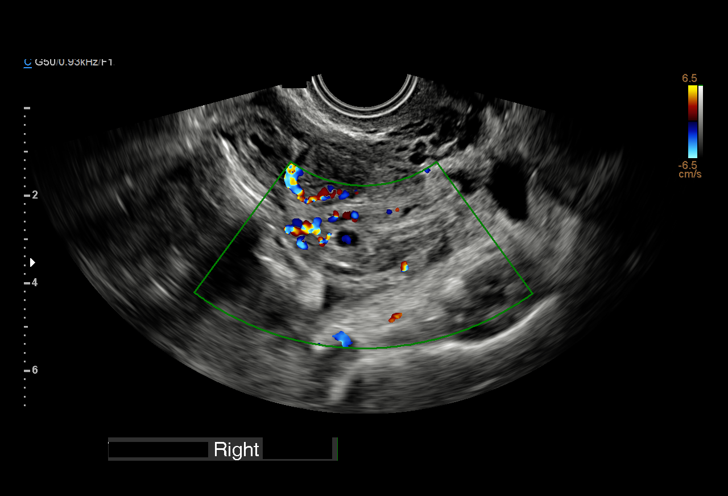
[im 15/34]
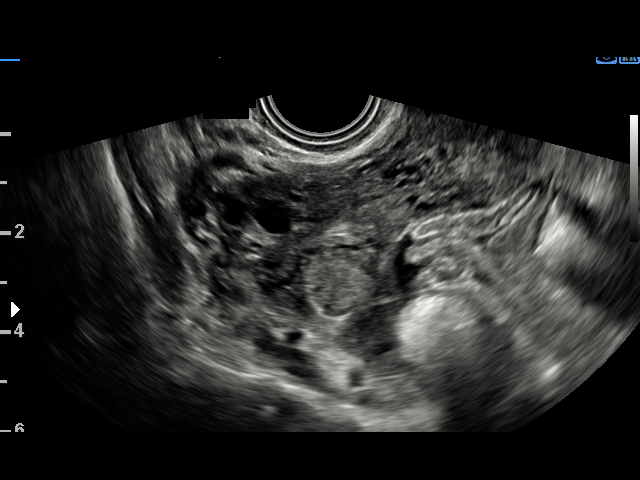
[im 18/34]
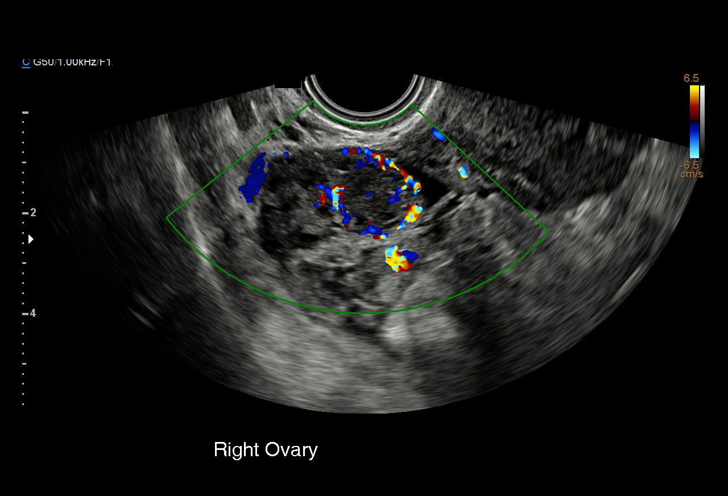
[im 19/34]
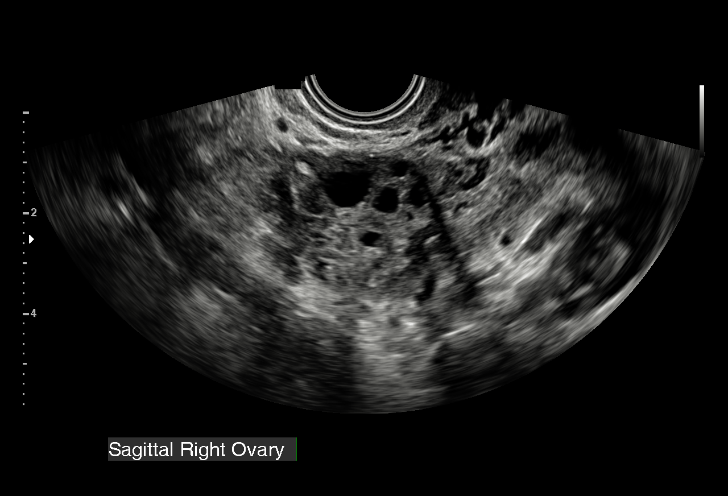
[im 21/34]
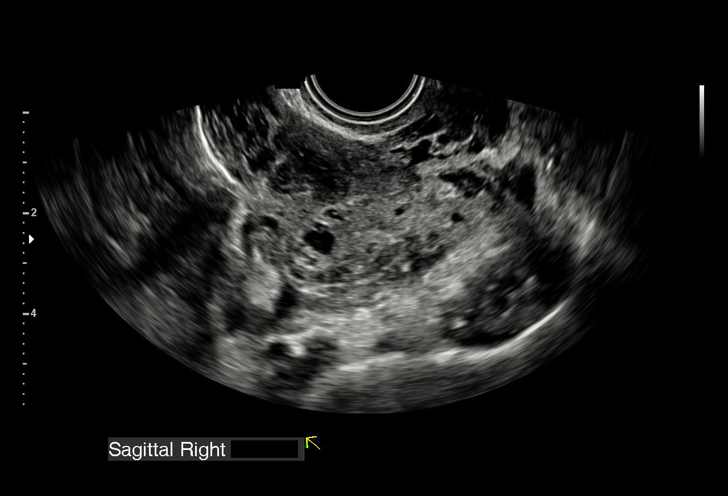
[im 24/34]
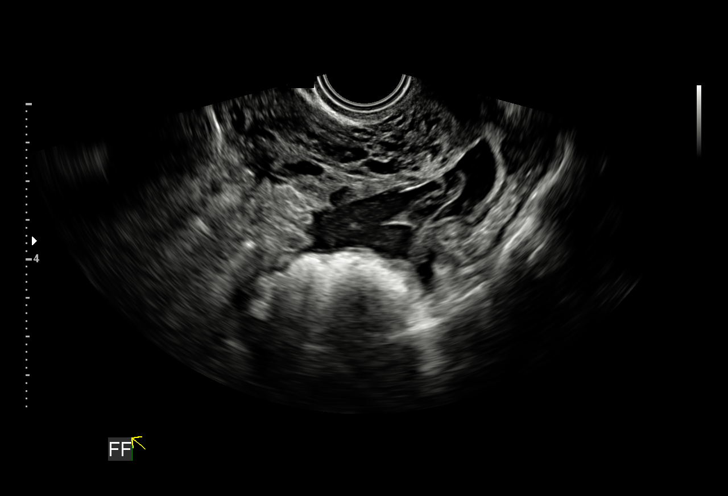
[im 26/34]
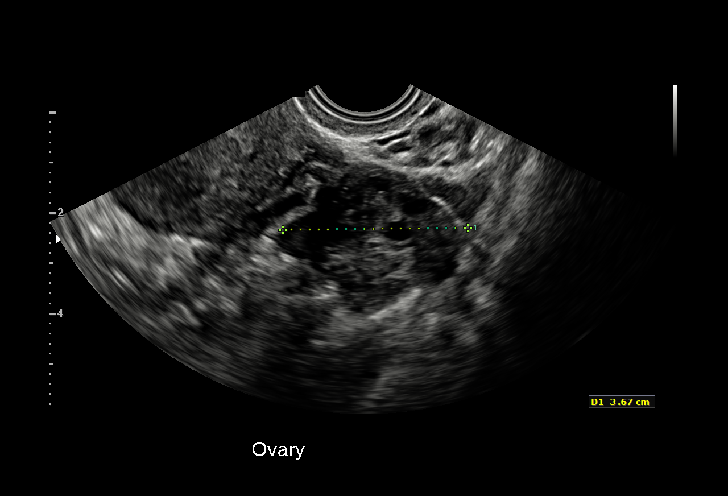
[im 29/34]
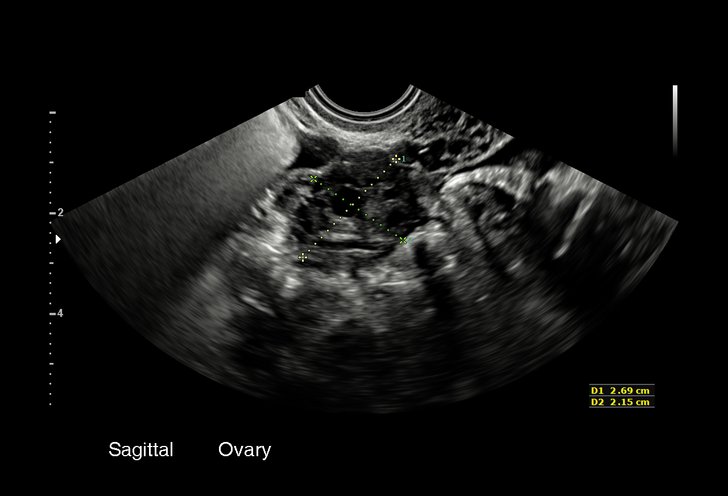
[im 31/34]
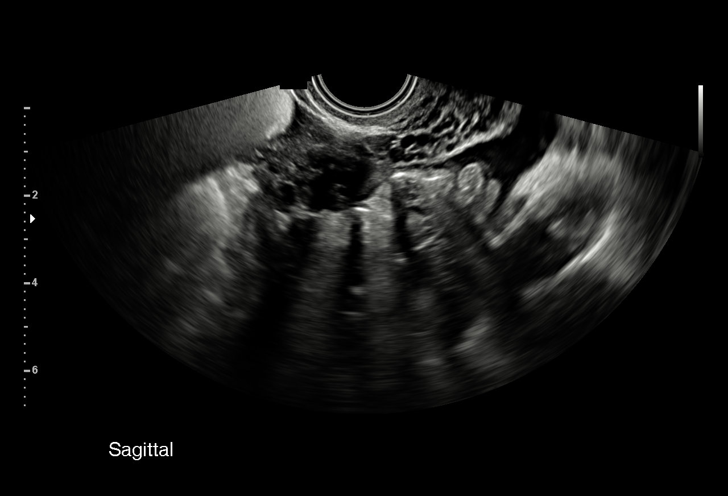
[im 34/34]
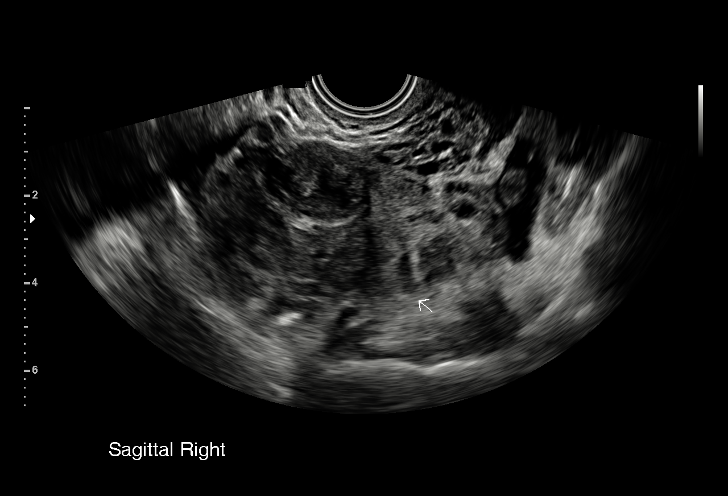

[15 of 28 positions shown; findings below may reference images not displayed]

FINDINGS: Intrauterine gestational sac: Not visualized

Yolk sac:  Not visualized

Embryo:  Not visualized

Maternal uterus/adnexae: Left ovary is normal and measures 3.7 x
x 2.2 cm. The right ovary measures 3 x 2.9 by 3.3 cm. Echogenic
complex mass in the right adnexa measuring 4.6 x 2.1 by 4.2 cm,
corresponding to history of ectopic pregnancy, previously measuring
4 x 2.4 x 3.2 cm. New moderate complex free fluid in the pelvis. On
cine images there appears to be clot within the fluid.
IMPRESSION: Redemonstrated right adnexal ectopic pregnancy, slight interval
increase in size, but now with moderate slightly complex pelvic free
fluid raising concern for rupture.

Critical Value/emergent results were called by telephone at the time
of interpretation on 06/24/2019 at [DATE] to provider Archila for
JAWAAD LUITERS , who verbally acknowledged these results.

## 2020-07-10 ENCOUNTER — Encounter: Payer: Self-pay | Admitting: Radiology

## 2020-10-02 ENCOUNTER — Other Ambulatory Visit (HOSPITAL_BASED_OUTPATIENT_CLINIC_OR_DEPARTMENT_OTHER): Payer: Self-pay

## 2020-10-02 ENCOUNTER — Other Ambulatory Visit: Payer: Self-pay

## 2020-10-02 ENCOUNTER — Encounter: Payer: Self-pay | Admitting: Family

## 2020-10-02 ENCOUNTER — Ambulatory Visit (INDEPENDENT_AMBULATORY_CARE_PROVIDER_SITE_OTHER): Payer: 59 | Admitting: Family

## 2020-10-02 VITALS — BP 100/80 | HR 68 | Temp 98.1°F | Ht 63.0 in | Wt 119.0 lb

## 2020-10-02 DIAGNOSIS — S61431A Puncture wound without foreign body of right hand, initial encounter: Secondary | ICD-10-CM | POA: Diagnosis not present

## 2020-10-02 DIAGNOSIS — W5503XA Scratched by cat, initial encounter: Secondary | ICD-10-CM | POA: Diagnosis not present

## 2020-10-02 DIAGNOSIS — Z23 Encounter for immunization: Secondary | ICD-10-CM

## 2020-10-02 MED ORDER — AZITHROMYCIN 250 MG PO TABS
ORAL_TABLET | ORAL | 0 refills | Status: DC
Start: 2020-10-02 — End: 2022-02-28
  Filled 2020-10-02: qty 6, 5d supply, fill #0

## 2020-10-02 NOTE — Progress Notes (Signed)
  Emily Lin is a 30 y.o. female with the following history as recorded in EpicCare:  Patient Active Problem List   Diagnosis Date Noted   Kidney stone 06/24/2019    Current Outpatient Medications  Medication Sig Dispense Refill   azithromycin (ZITHROMAX Z-PAK) 250 MG tablet Take 2 tablets today; then take 1 tablet daily x 4 days 6 each 0   No current facility-administered medications for this visit.    Allergies: Patient has no known allergies.  Past Medical History:  Diagnosis Date   Chronic kidney disease    Hx Kidney Stones    Past Surgical History:  Procedure Laterality Date   APPENDECTOMY     LITHOTRIPSY     WISDOM TOOTH EXTRACTION Bilateral     Family History  Problem Relation Age of Onset   Breast cancer Maternal Grandmother 20   Healthy Mother    Healthy Father     Social History   Tobacco Use   Smoking status: Never   Smokeless tobacco: Never  Substance Use Topics   Alcohol use: Not Currently    Comment: Socially    Subjective:  Patient was scratched by her cat last Friday while giving the cat medication; cat is up to date on all vaccines/ was not bitten by cat; Notes that concerned about amount of swelling/ bruising noted over top of hand; notes that swelling has improved tremendously and does have full range of motion; no warmth or streaking noted into right hand/ arm;   LMP- early August   Objective:  Vitals:   10/02/20 1429  BP: 100/80  Pulse: 68  Temp: 98.1 F (36.7 C)  TempSrc: Oral  SpO2: 98%  Weight: 119 lb (54 kg)  Height: 5\' 3"  (1.6 m)    General: Well developed, well nourished, in no acute distress  Skin : Warm and dry. Small puncture wound noted over top of right hand with surrounding bruising;  Head: Normocephalic and atraumatic  Eyes: Sclera and conjunctiva clear; pupils round and reactive to light; extraocular movements intact  Ears: External normal; canals clear; tympanic membranes normal  Oropharynx: Pink, supple. No suspicious  lesions  Neck: Supple without thyromegaly, adenopathy  Lungs: Respirations unlabored;  Neurologic: Alert and oriented; speech intact; face symmetrical; moves all extremities well; CNII-XII intact without focal deficit   Assessment:  1. Cat scratch     Plan:  Rx for Z-pak and Tdap updated; reassurance- appears to be healing/ resolving with no concerns for long term complications.  This visit occurred during the SARS-CoV-2 public health emergency.  Safety protocols were in place, including screening questions prior to the visit, additional usage of staff PPE, and extensive cleaning of exam room while observing appropriate contact time as indicated for disinfecting solutions.    No follow-ups on file.  Orders Placed This Encounter  Procedures   Tdap vaccine greater than or equal to 7yo IM    Requested Prescriptions   Signed Prescriptions Disp Refills   azithromycin (ZITHROMAX Z-PAK) 250 MG tablet 6 each 0    Sig: Take 2 tablets today; then take 1 tablet daily x 4 days

## 2020-10-18 ENCOUNTER — Other Ambulatory Visit (HOSPITAL_BASED_OUTPATIENT_CLINIC_OR_DEPARTMENT_OTHER): Payer: Self-pay

## 2020-10-27 ENCOUNTER — Other Ambulatory Visit (HOSPITAL_BASED_OUTPATIENT_CLINIC_OR_DEPARTMENT_OTHER): Payer: Self-pay

## 2021-01-31 ENCOUNTER — Ambulatory Visit (INDEPENDENT_AMBULATORY_CARE_PROVIDER_SITE_OTHER): Payer: 59 | Admitting: Advanced Practice Midwife

## 2021-01-31 ENCOUNTER — Other Ambulatory Visit: Payer: Self-pay

## 2021-01-31 ENCOUNTER — Encounter: Payer: Self-pay | Admitting: Advanced Practice Midwife

## 2021-01-31 VITALS — BP 111/79 | HR 69 | Wt 114.0 lb

## 2021-01-31 DIAGNOSIS — N760 Acute vaginitis: Secondary | ICD-10-CM | POA: Diagnosis not present

## 2021-01-31 MED ORDER — NYSTATIN-TRIAMCINOLONE 100000-0.1 UNIT/GM-% EX CREA: TOPICAL_CREAM | Freq: Two times a day (BID) | CUTANEOUS | 0 refills | Status: AC

## 2021-01-31 NOTE — Progress Notes (Signed)
LMP:01/30/21 Monthly . Med flow  usually last 5 days. Last Pap:10/13/2017 WNL  Contraception:condoms STD Screening:Yes Swab only  Family Hx of Breast Cancer:Yes MGM  Family Hx of Ovarian Cancer:No  CC: recurrent yeast infection causing skin irritation.

## 2021-02-01 NOTE — Progress Notes (Signed)
° ° °  GYNECOLOGY PROBLEM CARE ENCOUNTER NOTE  History:     Emily Lin is a 30 y.o. G29P0010 female here for evaluation of acute onset labial irritation including small area of skin breakdown.  Denies abnormal vaginal bleeding, discharge, pelvic pain, problems with intercourse or other gynecologic concerns.    Gynecologic History Patient's last menstrual period was 01/30/2021. Contraception: none Last Pap: 09/2017. Result was NILM   Obstetric History OB History  Gravida Para Term Preterm AB Living  1       1    SAB IAB Ectopic Multiple Live Births      1        # Outcome Date GA Lbr Len/2nd Weight Sex Delivery Anes PTL Lv  1 Ectopic             Past Medical History:  Diagnosis Date   Chronic kidney disease    Hx Kidney Stones    Past Surgical History:  Procedure Laterality Date   APPENDECTOMY     LITHOTRIPSY     WISDOM TOOTH EXTRACTION Bilateral     Current Outpatient Medications on File Prior to Visit  Medication Sig Dispense Refill   ibuprofen (ADVIL) 200 MG tablet Take 200 mg by mouth every 6 (six) hours as needed.     azithromycin (ZITHROMAX Z-PAK) 250 MG tablet Take 2 tablets today; then take 1 tablet daily x 4 days 6 each 0   No current facility-administered medications on file prior to visit.    No Known Allergies  Social History:  reports that she has never smoked. She has never used smokeless tobacco. She reports that she does not currently use alcohol. She reports that she does not use drugs.  Family History  Problem Relation Age of Onset   Healthy Mother    Healthy Father    Cancer Maternal Grandmother    Breast cancer Maternal Grandmother 8    The following portions of the patient's history were reviewed and updated as appropriate: allergies, current medications, past family history, past medical history, past social history, past surgical history and problem list.  Review of Systems Pertinent items noted in HPI and remainder of comprehensive ROS  otherwise negative.  Physical Exam:  BP 111/79    Pulse 69    Wt 114 lb (51.7 kg)    LMP 01/30/2021    BMI 20.19 kg/m  CONSTITUTIONAL: Well-developed, well-nourished female in no acute distress.  SKIN: Skin is warm and dry.  MUSCULOSKELETAL: Normal range of motion. No tenderness.  No cyanosis, clubbing, or edema. NEUROLOGIC: Alert and oriented to person, place, and time.  PELVIC: Normal appearing external genitalia and urethral meatus; Thin line of superficial irritation within folds of left labia. Internal exam deferred. Performed in the presence of a chaperone.   Assessment and Plan:    1. Vulvovaginitis - Will rx Mycolog based on indication and out of pocket cost - Originally scheduled we well woman but patient started period yesterday - Patient to return at her convenience for well woman + pap   Routine preventative health maintenance measures emphasized. Please refer to After Visit Summary for other counseling recommendations.     Total visit time: 15 min. Greater than 50% of visit spent in counseling and coordination of care.  Clayton Bibles, MSA, MSN, CNM Certified Nurse Midwife, Biochemist, clinical for [employer], [facility]

## 2021-03-28 ENCOUNTER — Ambulatory Visit: Payer: 59 | Admitting: Advanced Practice Midwife

## 2022-02-28 ENCOUNTER — Ambulatory Visit (INDEPENDENT_AMBULATORY_CARE_PROVIDER_SITE_OTHER): Payer: Commercial Managed Care - PPO | Admitting: Student

## 2022-02-28 ENCOUNTER — Encounter: Payer: Self-pay | Admitting: Student

## 2022-02-28 VITALS — BP 98/62 | HR 118 | Ht 63.0 in | Wt 119.6 lb

## 2022-02-28 DIAGNOSIS — R6889 Other general symptoms and signs: Secondary | ICD-10-CM | POA: Insufficient documentation

## 2022-02-28 NOTE — Patient Instructions (Addendum)
It was great to meet you today.  Let me know if your flu-like symptoms worsen. Hydrate and rest as much as possible.  I will call you with your test results.  Schedule a return visit at your convenience for lab work and to discuss your fertility journey.   If you have any questions or concerns, please feel free to call the clinic.    Be well,  Dr. Orvis Brill [facility] [phone]

## 2022-02-28 NOTE — Progress Notes (Signed)
    SUBJECTIVE:   CHIEF COMPLAINT / HPI:   Emily Lin is a 32 year-old female here to establish care with PCP.  Concerns today: Flu-like symptoms since Tuesday at 3 AM, sudden onset. Drinks half gallon a day of water. Vomited on day 1, no more vomiting. Taking Tylenol cold & flu- feels it is helping. She is also been having fatigue and a cough.  No shortness of breath or dyspnea.  Social history Occupation: Works as Metallurgist- Futures trader at [employer] with: Married to husband; has Community education officer Smoking/Vaping:  None Alcohol: Socially Marijuana: None Iilicit substances:  None   Medical history Allergies: None Daily medications: None Medical diagnoses: Kidney stones, Hx ectopic pregnancy Surgical hx:  Family hx:  Maternal grandmother with breast cancer, Paternal grandfather with MI (smoker), Mom and Dad both healthy.   Healthcare Maintenance COVID: Has not received Flu vaccines: Has not received  PERTINENT  PMH / PSH: See above  OBJECTIVE:   BP 98/62   Pulse (!) 118   Ht 5\' 3"  (1.6 m)   Wt 119 lb 9.6 oz (54.3 kg)   LMP 02/12/2022   SpO2 98%   Breastfeeding No   BMI 21.19 kg/m      ASSESSMENT/PLAN:   Flu-like symptoms 32 year old previously healthy female with 4 days of flulike symptoms.  Overall, she is tired appearing but nontoxic and in no distress. She is afebrile, although tachycardic to 110s with soft blood pressure in clinic today.  Likely has mild to moderate dehydration. Denies any dizziness.  Overall, feels her symptoms are improving a little bit.  No concern for bacterial infection including pneumonia.   -Obtained COVID and flu PCR test, we will follow-up on results -Encourage patient stay home and rest, hydrate.  Provided her with Pedialyte sticks. -Return precautions discussed.   Patient planning to conceive within the next year with her husband.  I discussed with her that we are happy to provide care regarding Pap  smear, conception counseling, etc. I did encourage her to start taking multivitamin with folic acid, or prenatal vitamin.  Orvis Brill, [place]

## 2022-02-28 NOTE — Assessment & Plan Note (Signed)
32 year old previously healthy female with 4 days of flulike symptoms.  Overall, she is tired appearing but nontoxic and in no distress. She is afebrile, although tachycardic to 110s with soft blood pressure in clinic today.  Likely has mild to moderate dehydration. Denies any dizziness.  Overall, feels her symptoms are improving a little bit.  No concern for bacterial infection including pneumonia.   -Obtained COVID and flu PCR test, we will follow-up on results -Encourage patient stay home and rest, hydrate.  Provided her with Pedialyte sticks. -Return precautions discussed.

## 2022-03-02 LAB — COVID-19, FLU A+B NAA
Influenza A, NAA: NOT DETECTED
Influenza B, NAA: DETECTED — AB
SARS-CoV-2, NAA: NOT DETECTED

## 2022-03-28 ENCOUNTER — Encounter: Payer: Commercial Managed Care - PPO | Admitting: Student

## 2022-04-11 ENCOUNTER — Other Ambulatory Visit (HOSPITAL_COMMUNITY)
Admission: RE | Admit: 2022-04-11 | Discharge: 2022-04-11 | Disposition: A | Discharge status: Home / Self Care | Payer: Commercial Managed Care - PPO | Source: Ambulatory Visit | Attending: Family Medicine | Admitting: Family Medicine

## 2022-04-11 ENCOUNTER — Ambulatory Visit (INDEPENDENT_AMBULATORY_CARE_PROVIDER_SITE_OTHER): Payer: Commercial Managed Care - PPO | Admitting: Student

## 2022-04-11 ENCOUNTER — Encounter: Payer: Self-pay | Admitting: Student

## 2022-04-11 VITALS — BP 104/62 | HR 73 | Ht 63.0 in | Wt 119.8 lb

## 2022-04-11 DIAGNOSIS — Z113 Encounter for screening for infections with a predominantly sexual mode of transmission: Secondary | ICD-10-CM

## 2022-04-11 DIAGNOSIS — Z124 Encounter for screening for malignant neoplasm of cervix: Secondary | ICD-10-CM | POA: Insufficient documentation

## 2022-04-11 DIAGNOSIS — Z131 Encounter for screening for diabetes mellitus: Secondary | ICD-10-CM | POA: Diagnosis not present

## 2022-04-11 DIAGNOSIS — Z130 Encounter for screening for diseases of the blood and blood-forming organs and certain disorders involving the immune mechanism: Secondary | ICD-10-CM

## 2022-04-11 NOTE — Assessment & Plan Note (Signed)
Pap smear completed today. Also obtained GC/Chlamydia, trichomonas. No abnormalities seen on exam

## 2022-04-11 NOTE — Progress Notes (Signed)
    SUBJECTIVE:   Chief compliant/HPI: annual examination  Emily Lin is a 32 y.o. who presents today for an annual exam. She has no acute concerns today.  She is trying to conceive.  Her LMP was 03/19/2022 and was a regular menstrual cycle. Not interested in taking a pregnancy test today.  She is taking a prenatal vitamin. She exercises and does not use tobacco.  Review of systems form notable for none.   Updated history tabs and problem list.    OBJECTIVE:   BP 104/62   Pulse 73   Ht 5' 3"$  (1.6 m)   Wt 119 lb 12.8 oz (54.3 kg)   LMP 03/19/2022   SpO2 98%   BMI 21.22 kg/m   General: Alert and cooperative and appears to be in no acute distress Cardio: Normal S1 and S2, no S3 or S4. Rhythm is regular. No murmurs or rubs.   Pulm: Clear to auscultation bilaterally, no crackles, wheezing, or diminished breath sounds. Normal respiratory effort Abdomen: Bowel sounds normal. Abdomen soft and non-tender.  GU: Nehemiah Settle, CMA present. External vulva and vagina nonerythematous, without any obvious lesions or rash. No discharge.  Normal ruggae of vaginal walls.  Cervix is non erythematous and non-friable.   Extremities: No peripheral edema. Warm/ well perfused.  Strong radial pulses. Neuro: Cranial nerves grossly intact  ASSESSMENT/PLAN:   Encounter for Papanicolaou smear of cervix Pap smear completed today. Also obtained GC/Chlamydia, trichomonas. No abnormalities seen on exam    Annual Examination  See AVS for age appropriate recommendations.   PHQ score 0, reviewed and discussed. Blood pressure reviewed and at goal.  The patient currently uses no contraception. Folate recommended as appropriate, minimum of 400 mcg per day.    Follow up in 1  year or sooner if indicated. Will follow-up if home urine pregnancy positive.    Orvis Brill, [place]

## 2022-04-11 NOTE — Patient Instructions (Signed)
It was great seeing you today, Melessia.  If trying to conceive, I recommend the following: - Limit caffeine to 2 cups of coffee per day. Avoid sugary sodas - Try to get regular moderate exercise (150  minutes per week) - Avoid smoking - Eat a well-balanced diet with enough protein, vegetables and fruit - Continue to take your prenatal vitamin  We collected testing today- I will call you if it is abnormal. If your results are normal, I will send you a MyChart message.   If you have any questions or concerns, please feel free to call the clinic.   Have a wonderful day,  Dr. Orvis Brill [facility] [phone]

## 2022-04-12 LAB — CBC
Hematocrit: 42 % (ref 34.0–46.6)
Hemoglobin: 14.2 g/dL (ref 11.1–15.9)
MCH: 31 pg (ref 26.6–33.0)
MCHC: 33.8 g/dL (ref 31.5–35.7)
MCV: 92 fL (ref 79–97)
Platelets: 306 10*3/uL (ref 150–450)
RBC: 4.58 x10E6/uL (ref 3.77–5.28)
RDW: 11.9 % (ref 11.7–15.4)
WBC: 4.8 10*3/uL (ref 3.4–10.8)

## 2022-04-12 LAB — HEMOGLOBIN A1C
Est. average glucose Bld gHb Est-mCnc: 94 mg/dL
Hgb A1c MFr Bld: 4.9 % (ref 4.8–5.6)

## 2022-04-12 LAB — HIV ANTIBODY (ROUTINE TESTING W REFLEX): HIV Screen 4th Generation wRfx: NONREACTIVE

## 2022-04-12 LAB — RPR: RPR Ser Ql: NONREACTIVE

## 2022-04-14 LAB — CYTOLOGY - PAP
Chlamydia: NEGATIVE
Comment: NEGATIVE
Comment: NEGATIVE
Comment: NORMAL
Diagnosis: NEGATIVE
Neisseria Gonorrhea: NEGATIVE
Trichomonas: NEGATIVE

## 2022-04-25 ENCOUNTER — Ambulatory Visit: Payer: Commercial Managed Care - PPO | Admitting: Family Medicine

## 2022-05-01 ENCOUNTER — Other Ambulatory Visit: Payer: Self-pay | Admitting: Student

## 2022-05-01 DIAGNOSIS — Z3A01 Less than 8 weeks gestation of pregnancy: Secondary | ICD-10-CM

## 2022-05-01 NOTE — Progress Notes (Signed)
Called patient regarding positive home pregnancy test for desired and planned pregnancy.  She is reasonably anxious about recurrence of ectopic pregnancy given remote history of this in 2021, so I went ahead and ordered an ultrasound to confirm IUP. She says she feels like normal and is having no symptoms.  She has appointment tomorrow at [facility]  as well.  Orvis Brill, DO

## 2022-05-01 NOTE — Progress Notes (Signed)
Placed order for US OB.

## 2022-05-02 ENCOUNTER — Ambulatory Visit: Payer: Commercial Managed Care - PPO | Admitting: Family Medicine

## 2022-05-02 ENCOUNTER — Encounter: Payer: Self-pay | Admitting: Family Medicine

## 2022-05-02 VITALS — BP 110/64 | HR 94 | Ht 63.0 in | Wt 120.0 lb

## 2022-05-02 DIAGNOSIS — Z3200 Encounter for pregnancy test, result unknown: Secondary | ICD-10-CM

## 2022-05-02 DIAGNOSIS — Z3401 Encounter for supervision of normal first pregnancy, first trimester: Secondary | ICD-10-CM | POA: Diagnosis not present

## 2022-05-02 LAB — POCT URINE PREGNANCY: Preg Test, Ur: POSITIVE — AB

## 2022-05-02 NOTE — Patient Instructions (Signed)
It was wonderful to see you today.  Please bring ALL of your medications with you to every visit.   Today we talked about:  Pregnancy - Congratulations! We will follow up on your initial prenatal labs at your first ob visit unless there is something abnormal. Your ultrasound is next Thursday at 9:30 am.  Please continue your prenatal vitamins and go to the MAU if you have any bleeding or pain.   Please follow up on 3/26 for your initial ob visit.   Thank you for choosing [facility].   Please call [phone] with any questions about today's appointment.   Lowry Ram, MD  Family Medicine   Prenatal Classes Go to [email or web address] for more information on the pregnancy and child birth classes that [facility] has to offer.   Pregnancy Related Return Precautions The follow are signs/symptoms that are abnormal in pregnancy and may require further evaluation by a physician: Go to the MAU at [place] at [facility] if: You have cramping/contractions that do not go away with drinking water, especially if they are lasting 30 seconds to 1.5 minutes, coming and going every 5-10 minutes for an hour or more, or are getting stronger and you cannot walk or talk while having a contraction/cramp. Your water breaks.  Sometimes it is a big gush of fluid, sometimes it is just a trickle that keeps getting your underwear wet or running down your legs You have vaginal bleeding.    You do not feel your baby moving like normal.  If you do not, get something to eat and drink (something cold or something with sugar like peanut butter or juice) and lay down and focus on feeling your baby move. If your baby is still not moving like normal, you should go to MAU. You should feel your baby move 6 times in one hour, or 10 times in two hours. You have a persistent headache that does not go away with 1 g of Tylenol, vision changes, chest pain,  difficulty breathing, severe pain in your right upper abdomen, worsening leg swelling- these can all be signs of high blood pressure in pregnancy and need to be evaluated by a provider immediately  These are all concerning in pregnancy and if you have any of these I recommend you call your PCP and present to the Maternity Admissions Unit (map below) for further evaluation.  For any pregnancy-related emergencies, please go to the Maternity Admissions Unit in the [place] at [facility] will use hospital Entrance C.    Our clinic number is [phone].   Dr Thompson Grayer

## 2022-05-02 NOTE — Progress Notes (Unsigned)
    SUBJECTIVE:   CHIEF COMPLAINT / HPI:   Confirm Pregnancy  Patient reports that she had multiple positive pregnancy test at home.  Her last LMP was 1/31.  She is very sure of her LMP.  She denies any bleeding abdominal pain or abnormal discharge.  She is concerned as she has a history of an ectopic pregnancy in 2021 and would like to make sure that this pregnancy is intrauterine.  This is a planned very desired pregnancy.  She has been taking prenatal vitamins.  PERTINENT  PMH / PSH: History of ectopic pregnancy in 2021   OBJECTIVE:   BP 110/64   Pulse 94   Ht 5\' 3"  (1.6 m)   Wt 120 lb (54.4 kg)   LMP 03/19/2022 (Exact Date)   SpO2 99%   BMI 21.26 kg/m   General: well appearing, in no acute distress CV: RRR, radial pulses equal and palpable, no BLE edema  Resp: Normal work of breathing on room air, CTAB Abd: Soft, non distended, tender to palpation of right lower abdomen, no guarding, no rebound guarding, no peritoneal signs Neuro: Alert & Oriented x 4    ASSESSMENT/PLAN:   Supervision of low-risk first pregnancy, first trimester Patient with positive pregnancy test here.  She has not a gravid.  At this time no signs of ectopic pregnancy or other abnormalities.  Her right lower quadrant abdominal pain is very mild and without any signs of abdominal pain at rest or bleeding lower risk for ectopic pregnancy at this time.  Has ultrasound scheduled by her PCP in 1 week to confirm intrauterine pregnancy. - Follow-up initial OB lab panel - Initial OB visit with PCP Dr. Owens Shark  -Follow-up OB ultrasound     Lowry Ram, MD [place]

## 2022-05-05 DIAGNOSIS — Z3401 Encounter for supervision of normal first pregnancy, first trimester: Secondary | ICD-10-CM | POA: Insufficient documentation

## 2022-05-05 LAB — CBC/D/PLT+RPR+RH+ABO+RUBIGG...
Antibody Screen: NEGATIVE
Basophils Absolute: 0.1 10*3/uL (ref 0.0–0.2)
Basos: 2 %
Bilirubin, UA: NEGATIVE
EOS (ABSOLUTE): 0.2 10*3/uL (ref 0.0–0.4)
Eos: 5 %
Glucose, UA: NEGATIVE
HCV Ab: NONREACTIVE
HIV Screen 4th Generation wRfx: NONREACTIVE
Hematocrit: 43.2 % (ref 34.0–46.6)
Hemoglobin: 14.2 g/dL (ref 11.1–15.9)
Hepatitis B Surface Ag: NEGATIVE
Immature Grans (Abs): 0 10*3/uL (ref 0.0–0.1)
Immature Granulocytes: 1 %
Ketones, UA: NEGATIVE
Lymphocytes Absolute: 1.2 10*3/uL (ref 0.7–3.1)
Lymphs: 23 %
MCH: 30.8 pg (ref 26.6–33.0)
MCHC: 32.9 g/dL (ref 31.5–35.7)
MCV: 94 fL (ref 79–97)
Monocytes Absolute: 0.4 10*3/uL (ref 0.1–0.9)
Monocytes: 8 %
Neutrophils Absolute: 3.1 10*3/uL (ref 1.4–7.0)
Neutrophils: 61 %
Nitrite, UA: NEGATIVE
Platelets: 331 10*3/uL (ref 150–450)
Protein,UA: NEGATIVE
RBC, UA: NEGATIVE
RBC: 4.61 x10E6/uL (ref 3.77–5.28)
RDW: 11.9 % (ref 11.7–15.4)
RPR Ser Ql: NONREACTIVE
Rh Factor: NEGATIVE
Rubella Antibodies, IGG: 4.05 index (ref >0.99)
Specific Gravity, UA: 1.013 (ref 1.005–1.030)
Urobilinogen, Ur: 0.2 mg/dL (ref 0.2–1.0)
WBC: 5 10*3/uL (ref 3.4–10.8)
pH, UA: 6.5 (ref 5.0–7.5)

## 2022-05-05 LAB — MICROSCOPIC EXAMINATION
Casts: NONE SEEN /lpf
WBC, UA: NONE SEEN /hpf (ref 0–5)

## 2022-05-05 LAB — URINE CULTURE, OB REFLEX

## 2022-05-05 LAB — HCV INTERPRETATION

## 2022-05-05 NOTE — Assessment & Plan Note (Signed)
Patient with positive pregnancy test here.  She has not a gravid.  At this time no signs of ectopic pregnancy or other abnormalities.  Her right lower quadrant abdominal pain is very mild and without any signs of abdominal pain at rest or bleeding lower risk for ectopic pregnancy at this time.  Has ultrasound scheduled by her PCP in 1 week to confirm intrauterine pregnancy. - Follow-up initial OB lab panel - Initial OB visit with PCP Dr. Owens Shark  -Follow-up OB ultrasound

## 2022-05-08 ENCOUNTER — Encounter: Payer: Self-pay | Admitting: Student

## 2022-05-08 ENCOUNTER — Other Ambulatory Visit: Payer: Commercial Managed Care - PPO

## 2022-05-08 ENCOUNTER — Telehealth: Payer: Self-pay

## 2022-05-08 ENCOUNTER — Ambulatory Visit
Admission: RE | Admit: 2022-05-08 | Discharge: 2022-05-08 | Disposition: A | Discharge status: Home / Self Care | Payer: Commercial Managed Care - PPO | Source: Ambulatory Visit | Attending: Family Medicine | Admitting: Family Medicine

## 2022-05-08 ENCOUNTER — Encounter: Payer: Self-pay | Admitting: Certified Nurse Midwife

## 2022-05-08 ENCOUNTER — Other Ambulatory Visit: Payer: Self-pay | Admitting: Student

## 2022-05-08 DIAGNOSIS — Z3A01 Less than 8 weeks gestation of pregnancy: Secondary | ICD-10-CM

## 2022-05-08 DIAGNOSIS — O26891 Other specified pregnancy related conditions, first trimester: Secondary | ICD-10-CM | POA: Diagnosis not present

## 2022-05-08 NOTE — Telephone Encounter (Signed)
Called patient once mychart message was received.   Patient reports frustration that a lab apt had not been made. She reports arriving to [facility] for blood work, however had to wait ~30 minutes before blood was collected.  I apologized to patient and advised we will call her as soon as the results were back.

## 2022-05-08 NOTE — Telephone Encounter (Signed)
Received call from [place] regarding [place] results.   See the following impression:  IMPRESSION: 1. No IUP. Indeterminate appearance of the right ovary (possibly physiologic corpus luteum). No pelvis free fluid. 2. Differential considerations include failed IUP, early IUP, and right adnexal ectopic pregnancy. Recommend serial quantitative beta HCG and repeat Ultrasound.  They are requesting that we try to discuss these results and schedule patient for HCG lab levels today.   Forwarding to PCP and ordering provider.   Talbot Grumbling, RN

## 2022-05-08 NOTE — Progress Notes (Signed)
Received ultrasound results indicating possible failed IUP versus possible right ectopic pregnancy given lack of gestational sac and concerning findings in right adnexa.  Patient has a history of right adnexal ectopic pregnancy with methotrexate treatment 2021.  She did not have a salpingectomy.  I called OB attending on call (Dr. Ernestina Patches) and discussed the case with her and Vicente Serene, CMA.  Per their recommendations, given that the patient is not having any symptoms (vaginal bleeding, dizziness, abdominal pain), and this is a very desired pregnancy, she has 2 options:  1.  Monitor beta-hCG quantitative levels, STAT once today and repeat beta-hCG again in 3 days 2.  Proceed with termination of pregnancy with MTX with OBGYN  Patient opted for monitoring to see if beta-hCG levels will appropriately rise in the next few days.  I did discuss that sonographic evidence is most concerning for ectopic pregnancy especially in light of her history and the fact that she still has her right fallopian tube with the right ovary showing hypervascularity and a walled cyst.  I scheduled lab appointment at [facility] clinic with order for STAT b-hCG  I informed her she will have to go to the MAU on 05/10/2022 for repeat level. I placed an urgent referral to OB/GYN today as well.  Extensively discussed return precautions should she develop any concerning symptoms including abdominal pain or cramping, vaginal bleeding, weakness or dizziness.  Orvis Brill, DO

## 2022-05-09 ENCOUNTER — Encounter: Payer: Self-pay | Admitting: Student

## 2022-05-09 ENCOUNTER — Inpatient Hospital Stay (EMERGENCY_DEPARTMENT_HOSPITAL): Payer: Commercial Managed Care - PPO | Admitting: Anesthesiology

## 2022-05-09 ENCOUNTER — Inpatient Hospital Stay (HOSPITAL_COMMUNITY): Payer: Commercial Managed Care - PPO | Admitting: Anesthesiology

## 2022-05-09 ENCOUNTER — Other Ambulatory Visit: Payer: Self-pay

## 2022-05-09 ENCOUNTER — Inpatient Hospital Stay (HOSPITAL_COMMUNITY)
Admission: AD | Admit: 2022-05-09 | Discharge: 2022-05-09 | Disposition: A | Discharge status: Home / Self Care | Payer: Commercial Managed Care - PPO | Attending: Obstetrics & Gynecology | Admitting: Obstetrics & Gynecology

## 2022-05-09 ENCOUNTER — Inpatient Hospital Stay (HOSPITAL_COMMUNITY): Payer: Commercial Managed Care - PPO

## 2022-05-09 ENCOUNTER — Encounter (HOSPITAL_COMMUNITY)
Admission: AD | Disposition: A | Discharge status: Home / Self Care | Payer: Self-pay | Source: Home / Self Care | Attending: Obstetrics & Gynecology

## 2022-05-09 ENCOUNTER — Telehealth: Payer: Self-pay | Admitting: Obstetrics and Gynecology

## 2022-05-09 ENCOUNTER — Other Ambulatory Visit (HOSPITAL_BASED_OUTPATIENT_CLINIC_OR_DEPARTMENT_OTHER): Payer: Self-pay

## 2022-05-09 DIAGNOSIS — Z3A01 Less than 8 weeks gestation of pregnancy: Secondary | ICD-10-CM | POA: Diagnosis not present

## 2022-05-09 DIAGNOSIS — K661 Hemoperitoneum: Secondary | ICD-10-CM | POA: Diagnosis not present

## 2022-05-09 DIAGNOSIS — O00101 Right tubal pregnancy without intrauterine pregnancy: Secondary | ICD-10-CM | POA: Diagnosis not present

## 2022-05-09 DIAGNOSIS — O009 Unspecified ectopic pregnancy without intrauterine pregnancy: Secondary | ICD-10-CM

## 2022-05-09 DIAGNOSIS — Z3A00 Weeks of gestation of pregnancy not specified: Secondary | ICD-10-CM | POA: Diagnosis not present

## 2022-05-09 HISTORY — PX: DIAGNOSTIC LAPAROSCOPY WITH REMOVAL OF ECTOPIC PREGNANCY: SHX6449

## 2022-05-09 HISTORY — PX: LAPAROSCOPIC UNILATERAL SALPINGECTOMY: SHX5934

## 2022-05-09 LAB — CBC
HCT: 41.3 % (ref 36.0–46.0)
Hemoglobin: 14.1 g/dL (ref 12.0–15.0)
MCH: 31.9 pg (ref 26.0–34.0)
MCHC: 34.1 g/dL (ref 30.0–36.0)
MCV: 93.4 fL (ref 80.0–100.0)
Platelets: 319 10*3/uL (ref 150–400)
RBC: 4.42 MIL/uL (ref 3.87–5.11)
RDW: 12.1 % (ref 11.5–15.5)
WBC: 7.6 10*3/uL (ref 4.0–10.5)
nRBC: 0 % (ref 0.0–0.2)

## 2022-05-09 LAB — BETA HCG QUANT (REF LAB): hCG Quant: 5523 m[IU]/mL

## 2022-05-09 LAB — HCG, QUANTITATIVE, PREGNANCY: hCG, Beta Chain, Quant, S: 5929 m[IU]/mL — ABNORMAL HIGH (ref <5)

## 2022-05-09 LAB — TYPE AND SCREEN
ABO/RH(D): A NEG
Antibody Screen: NEGATIVE

## 2022-05-09 SURGERY — LAPAROSCOPY, WITH ECTOPIC PREGNANCY SURGICAL TREATMENT
Anesthesia: General | Site: Abdomen | Laterality: Right

## 2022-05-09 MED ORDER — CEFAZOLIN SODIUM-DEXTROSE 2-3 GM-%(50ML) IV SOLR
INTRAVENOUS | Status: DC | PRN
  Administered 2022-05-09: 2 g via INTRAVENOUS

## 2022-05-09 MED ORDER — LACTATED RINGERS IV SOLN: INTRAVENOUS | Status: DC

## 2022-05-09 MED ORDER — DEXMEDETOMIDINE HCL IN NACL 80 MCG/20ML IV SOLN
INTRAVENOUS | Status: AC
  Filled 2022-05-09: qty 20

## 2022-05-09 MED ORDER — ROCURONIUM BROMIDE 10 MG/ML (PF) SYRINGE
PREFILLED_SYRINGE | INTRAVENOUS | Status: DC | PRN
  Administered 2022-05-09: 10 mg via INTRAVENOUS
  Administered 2022-05-09: 40 mg via INTRAVENOUS

## 2022-05-09 MED ORDER — DEXMEDETOMIDINE HCL IN NACL 80 MCG/20ML IV SOLN
INTRAVENOUS | Status: DC | PRN
  Administered 2022-05-09: 8 ug via BUCCAL

## 2022-05-09 MED ORDER — FENTANYL CITRATE (PF) 250 MCG/5ML IJ SOLN
INTRAMUSCULAR | Status: AC
  Filled 2022-05-09: qty 5

## 2022-05-09 MED ORDER — BUPIVACAINE HCL (PF) 0.5 % IJ SOLN
INTRAMUSCULAR | Status: DC | PRN
  Administered 2022-05-09: 6.5 mL

## 2022-05-09 MED ORDER — RHO D IMMUNE GLOBULIN 1500 UNIT/2ML IJ SOSY
300.0000 ug | PREFILLED_SYRINGE | Freq: Once | INTRAMUSCULAR | Status: AC
  Administered 2022-05-09: 300 ug via INTRAMUSCULAR
  Filled 2022-05-09: qty 2

## 2022-05-09 MED ORDER — IBUPROFEN 600 MG PO TABS
600.0000 mg | ORAL_TABLET | Freq: Four times a day (QID) | ORAL | 2 refills | Status: DC | PRN
  Filled 2022-05-09: qty 30, 8d supply, fill #0

## 2022-05-09 MED ORDER — 0.9 % SODIUM CHLORIDE (POUR BTL) OPTIME
TOPICAL | Status: DC | PRN
  Administered 2022-05-09: 1000 mL

## 2022-05-09 MED ORDER — ACETAMINOPHEN 10 MG/ML IV SOLN
INTRAVENOUS | Status: DC | PRN
  Administered 2022-05-09: 1000 mg via INTRAVENOUS

## 2022-05-09 MED ORDER — SUGAMMADEX SODIUM 200 MG/2ML IV SOLN
INTRAVENOUS | Status: DC | PRN
  Administered 2022-05-09: 150 mg via INTRAVENOUS

## 2022-05-09 MED ORDER — KETOROLAC TROMETHAMINE 30 MG/ML IJ SOLN
INTRAMUSCULAR | Status: DC | PRN
  Administered 2022-05-09: 30 mg via INTRAVENOUS

## 2022-05-09 MED ORDER — MIDAZOLAM HCL 2 MG/2ML IJ SOLN
INTRAMUSCULAR | Status: AC
  Filled 2022-05-09: qty 2

## 2022-05-09 MED ORDER — OXYCODONE HCL 5 MG PO TABS
5.0000 mg | ORAL_TABLET | Freq: Four times a day (QID) | ORAL | 0 refills | Status: DC | PRN
  Filled 2022-05-09: qty 20, 5d supply, fill #0

## 2022-05-09 MED ORDER — ACETAMINOPHEN 10 MG/ML IV SOLN
INTRAVENOUS | Status: AC
  Filled 2022-05-09: qty 100

## 2022-05-09 MED ORDER — DEXAMETHASONE SODIUM PHOSPHATE 10 MG/ML IJ SOLN
INTRAMUSCULAR | Status: DC | PRN
  Administered 2022-05-09: 10 mg via INTRAVENOUS

## 2022-05-09 MED ORDER — MIDAZOLAM HCL 2 MG/2ML IJ SOLN
INTRAMUSCULAR | Status: DC | PRN
  Administered 2022-05-09: 2 mg via INTRAVENOUS

## 2022-05-09 MED ORDER — OXYCODONE HCL 5 MG PO TABS: 5.0000 mg | ORAL_TABLET | Freq: Four times a day (QID) | ORAL | 0 refills | Status: DC | PRN

## 2022-05-09 MED ORDER — ONDANSETRON HCL 4 MG/2ML IJ SOLN
INTRAMUSCULAR | Status: AC
  Filled 2022-05-09: qty 2

## 2022-05-09 MED ORDER — LACTATED RINGERS IV BOLUS
1000.0000 mL | Freq: Once | INTRAVENOUS | Status: AC
  Administered 2022-05-09: 1000 mL via INTRAVENOUS

## 2022-05-09 MED ORDER — FENTANYL CITRATE (PF) 100 MCG/2ML IJ SOLN: 25.0000 ug | INTRAMUSCULAR | Status: DC | PRN

## 2022-05-09 MED ORDER — CHLORHEXIDINE GLUCONATE 0.12 % MT SOLN
15.0000 mL | Freq: Once | OROMUCOSAL | Status: AC
  Administered 2022-05-09: 15 mL via OROMUCOSAL

## 2022-05-09 MED ORDER — ONDANSETRON HCL 4 MG/2ML IJ SOLN
INTRAMUSCULAR | Status: DC | PRN
  Administered 2022-05-09: 4 mg via INTRAVENOUS

## 2022-05-09 MED ORDER — FENTANYL CITRATE (PF) 250 MCG/5ML IJ SOLN
INTRAMUSCULAR | Status: DC | PRN
  Administered 2022-05-09: 25 ug via INTRAVENOUS
  Administered 2022-05-09: 50 ug via INTRAVENOUS
  Administered 2022-05-09: 25 ug via INTRAVENOUS
  Administered 2022-05-09: 150 ug via INTRAVENOUS

## 2022-05-09 MED ORDER — IBUPROFEN 600 MG PO TABS: 600.0000 mg | ORAL_TABLET | Freq: Four times a day (QID) | ORAL | 2 refills | Status: DC | PRN

## 2022-05-09 MED ORDER — PROPOFOL 10 MG/ML IV BOLUS
INTRAVENOUS | Status: DC | PRN
  Administered 2022-05-09: 30 mg via INTRAVENOUS
  Administered 2022-05-09: 120 mg via INTRAVENOUS

## 2022-05-09 MED ORDER — SCOPOLAMINE 1 MG/3DAYS TD PT72
MEDICATED_PATCH | TRANSDERMAL | Status: DC | PRN
  Administered 2022-05-09: 1 via TRANSDERMAL

## 2022-05-09 MED ORDER — ORAL CARE MOUTH RINSE: 15.0000 mL | Freq: Once | OROMUCOSAL | Status: AC

## 2022-05-09 MED ORDER — PROPOFOL 10 MG/ML IV BOLUS
INTRAVENOUS | Status: AC
  Filled 2022-05-09: qty 20

## 2022-05-09 MED ORDER — DOCUSATE SODIUM 100 MG PO CAPS
100.0000 mg | ORAL_CAPSULE | Freq: Two times a day (BID) | ORAL | 2 refills | Status: DC | PRN
  Filled 2022-05-09: qty 30, 15d supply, fill #0

## 2022-05-09 MED ORDER — SODIUM CHLORIDE 0.9 % IR SOLN
Status: DC | PRN
  Administered 2022-05-09: 1000 mL

## 2022-05-09 MED ORDER — CEFAZOLIN SODIUM 1 G IJ SOLR
INTRAMUSCULAR | Status: AC
  Filled 2022-05-09: qty 20

## 2022-05-09 MED ORDER — BUPIVACAINE HCL (PF) 0.5 % IJ SOLN
INTRAMUSCULAR | Status: AC
  Filled 2022-05-09: qty 30

## 2022-05-09 MED ORDER — DEXAMETHASONE SODIUM PHOSPHATE 10 MG/ML IJ SOLN
INTRAMUSCULAR | Status: AC
  Filled 2022-05-09: qty 1

## 2022-05-09 MED ORDER — PROMETHAZINE HCL 25 MG/ML IJ SOLN: 6.2500 mg | INTRAMUSCULAR | Status: DC | PRN

## 2022-05-09 MED ORDER — LIDOCAINE 2% (20 MG/ML) 5 ML SYRINGE
INTRAMUSCULAR | Status: DC | PRN
  Administered 2022-05-09: 60 mg via INTRAVENOUS

## 2022-05-09 MED ORDER — SUCCINYLCHOLINE CHLORIDE 200 MG/10ML IV SOSY
PREFILLED_SYRINGE | INTRAVENOUS | Status: DC | PRN
  Administered 2022-05-09: 80 mg via INTRAVENOUS

## 2022-05-09 MED ORDER — KETOROLAC TROMETHAMINE 30 MG/ML IJ SOLN
INTRAMUSCULAR | Status: AC
  Filled 2022-05-09: qty 1

## 2022-05-09 MED ORDER — PHENYLEPHRINE 80 MCG/ML (10ML) SYRINGE FOR IV PUSH (FOR BLOOD PRESSURE SUPPORT)
PREFILLED_SYRINGE | INTRAVENOUS | Status: DC | PRN
  Administered 2022-05-09: 120 ug via INTRAVENOUS
  Administered 2022-05-09 (×4): 80 ug via INTRAVENOUS

## 2022-05-09 MED ORDER — DOCUSATE SODIUM 100 MG PO CAPS: 100.0000 mg | ORAL_CAPSULE | Freq: Two times a day (BID) | ORAL | 1 refills | Status: DC | PRN

## 2022-05-09 SURGICAL SUPPLY — 28 items
CABLE HIGH FREQUENCY MONO STRZ (ELECTRODE) IMPLANT
CATH ROBINSON RED A/P 16FR (CATHETERS) ×2 IMPLANT
DERMABOND ADVANCED .7 DNX12 (GAUZE/BANDAGES/DRESSINGS) ×2 IMPLANT
DURAPREP 26ML APPLICATOR (WOUND CARE) ×2 IMPLANT
GLOVE BIO SURGEON STRL SZ 6.5 (GLOVE) ×2 IMPLANT
GLOVE BIOGEL PI IND STRL 7.0 (GLOVE) ×6 IMPLANT
GLOVE ECLIPSE 7.0 STRL STRAW (GLOVE) ×2 IMPLANT
GOWN STRL REUS W/ TWL LRG LVL3 (GOWN DISPOSABLE) ×4 IMPLANT
GOWN STRL REUS W/TWL LRG LVL3 (GOWN DISPOSABLE) ×4
IRRIG SUCT STRYKERFLOW 2 WTIP (MISCELLANEOUS)
IRRIGATION SUCT STRKRFLW 2 WTP (MISCELLANEOUS) IMPLANT
KIT PINK PAD W/HEAD ARE REST (MISCELLANEOUS) IMPLANT
KIT PINK PAD W/HEAD ARM REST (MISCELLANEOUS) IMPLANT
KIT TURNOVER KIT B (KITS) ×2 IMPLANT
NS IRRIG 1000ML POUR BTL (IV SOLUTION) ×2 IMPLANT
PACK LAPAROSCOPY BASIN (CUSTOM PROCEDURE TRAY) ×2 IMPLANT
PROTECTOR NERVE ULNAR (MISCELLANEOUS) ×4 IMPLANT
SET TUBE SMOKE EVAC HIGH FLOW (TUBING) ×2 IMPLANT
SHEARS HARMONIC ACE PLUS 36CM (ENDOMECHANICALS) IMPLANT
SLEEVE Z-THREAD 5X100MM (TROCAR) ×2 IMPLANT
SUT MNCRL AB 4-0 PS2 18 (SUTURE) ×2 IMPLANT
SUT VICRYL 0 UR6 27IN ABS (SUTURE) IMPLANT
SYS BAG RETRIEVAL 10MM (BASKET)
SYSTEM BAG RETRIEVAL 10MM (BASKET) IMPLANT
TOWEL GREEN STERILE FF (TOWEL DISPOSABLE) ×4 IMPLANT
TRAY FOLEY W/BAG SLVR 14FR (SET/KITS/TRAYS/PACK) IMPLANT
TROCAR 11X100 Z THREAD (TROCAR) IMPLANT
TROCAR XCEL NON-BLD 5MMX100MML (ENDOMECHANICALS) ×2 IMPLANT

## 2022-05-09 NOTE — MAU Note (Signed)
...  Emily Lin is a 32 y.o. at Unknown here in MAU reporting: Right sided lower abdominal pain that began thirty minutes prior to arrival. Upon pulling patient to the room patient very pale and diaphoretic. Patient endorsing dizziness. Patient unable to stand on her own. Denies VB.  Patient being followed closely for right ectopic pregnancy. Patient had STAT hcg yesterday with a quant of 5523. Patient was supposed to have follow up quant tomorrow in MAU. Hx of right sided ectopic in 2021 and received MTX.  Jorje Guild, NP, notified immediately and at bedside.  LMP: 03/19/2022 Onset of complaint: 30 minutes prior Pain score: 7/10 right lower abdominal

## 2022-05-09 NOTE — Transfer of Care (Signed)
Immediate Anesthesia Transfer of Care Note  Patient: Emily Lin  Procedure(s) Performed: DIAGNOSTIC LAPAROSCOPY WITH REMOVAL OF ECTOPIC PREGNANCY (Abdomen) LAPAROSCOPIC RIGHT SALPINGECTOMY (Right: Abdomen)  Patient Location: PACU  Anesthesia Type:General  Level of Consciousness: awake, alert , and oriented  Airway & Oxygen Therapy: Patient Spontanous Breathing  Post-op Assessment: Report given to RN and Post -op Vital signs reviewed and stable  Post vital signs: Reviewed and stable  Last Vitals:  Vitals Value Taken Time  BP 96/53 05/09/22 1200  Temp    Pulse 71 05/09/22 1203  Resp 12 05/09/22 1203  SpO2 99 % 05/09/22 1203  Vitals shown include unvalidated device data.  Last Pain:  Vitals:   05/09/22 1009  TempSrc: Oral  PainSc: 7          Complications: No notable events documented.

## 2022-05-09 NOTE — Telephone Encounter (Signed)
Received call from after hours RN that patient's oxycodone prescription needed to be transferred to another pharmacy as her initial one was not sent to their closest location. Rx sent.   Gale Journey, MD [place], [facility] for [employer], [place]

## 2022-05-09 NOTE — Discharge Instructions (Signed)

## 2022-05-09 NOTE — Anesthesia Postprocedure Evaluation (Signed)
Anesthesia Post Note  Patient: Emily Lin  Procedure(s) Performed: DIAGNOSTIC LAPAROSCOPY WITH REMOVAL OF ECTOPIC PREGNANCY (Abdomen) LAPAROSCOPIC RIGHT SALPINGECTOMY (Right: Abdomen)     Patient location during evaluation: PACU Anesthesia Type: General Level of consciousness: awake and alert Pain management: pain level controlled Vital Signs Assessment: post-procedure vital signs reviewed and stable Respiratory status: spontaneous breathing, nonlabored ventilation, respiratory function stable and patient connected to nasal cannula oxygen Cardiovascular status: blood pressure returned to baseline and stable Postop Assessment: no apparent nausea or vomiting Anesthetic complications: no   No notable events documented.  Last Vitals:  Vitals:   05/09/22 1245 05/09/22 1300  BP: 97/67 96/70  Pulse: 77 61  Resp: 14 12  Temp:  36.4 C  SpO2: 100% 100%    Last Pain:  Vitals:   05/09/22 1300  TempSrc:   PainSc: 0-No pain                 Santa Lighter

## 2022-05-09 NOTE — MAU Provider Note (Signed)
Please refer to H & P for preoperative details for this patient with ruptured ectopic pregnancy.    Verita Schneiders, MD, [place] Attending [place], [facility]

## 2022-05-09 NOTE — H&P (Addendum)
Preoperative History and Physical  Emily Lin is a 32 y.o. G2P0010 at [redacted]w[redacted]d by LMP here for surgical management of ruptured ectopic pregnancy.  Was diagnosed with likely right tubal ectopic pregnancy on scan done 05/08/22, but patient was stable and close follow up was recommended. HCG was 5523.  Patient reports history of right tubal ectopic pregnancy in 2021 treated with methotrexate. Today, she had severe right sided lower abdominal pain that started around 0830 which prompted her to come to [facility]. On arrival, she was noted to be pale and diaphretic, also very dizzy.  Her vitals were suprisingly stable. She was taken a room and a stat bedside ultrasound revealed hemoperitoneum and right sided adnexal mass concerning for ruptured ectopic pregnancy.  NPO since 8 pm last night.  No significant preoperative concerns.  Proposed surgery: Laparoscopy, removal of ectopic pregnancy and associated fallopian tube  Past Medical History:  Diagnosis Date   Chronic kidney disease    Hx Kidney Stones   Past Surgical History:  Procedure Laterality Date   APPENDECTOMY     LITHOTRIPSY     WISDOM TOOTH EXTRACTION Bilateral    OB History  Gravida Para Term Preterm AB Living  2       1    SAB IAB Ectopic Multiple Live Births      1        # Outcome Date GA Lbr Len/2nd Weight Sex Delivery Anes PTL Lv  2 Current           1 Ectopic 2021             Birth Comments: Patient reported it was right tubal, treated with Methotrexate  Patient denies any other pertinent gynecologic issues.   No current facility-administered medications on file prior to encounter.   No current outpatient medications on file prior to encounter.   No Known Allergies  Social History:   reports that she has never smoked. She has never used smokeless tobacco. She reports that she does not currently use alcohol. She reports that she does not use drugs.  Family History  Problem Relation Age of Onset   Healthy Mother    Healthy  Father    Cancer Maternal Grandmother    Breast cancer Maternal Grandmother 22    Review of Systems: Pertinent items noted in HPI and remainder of comprehensive ROS otherwise negative.  PHYSICAL EXAM: Blood pressure 119/74, pulse 60, temperature 97.8 F (36.6 C), temperature source Oral, resp. rate 16, height 5\' 3"  (1.6 m), weight 52.6 kg, last menstrual period 03/19/2022, SpO2 100 %. CONSTITUTIONAL: Pale female in some distress due to pain.  HENT:  Normocephalic, atraumatic, External right and left ear normal. Oropharynx is clear and moist EYES: Conjunctivae and EOM are normal. Pupils are equal, round, and reactive to light. No scleral icterus.  NECK: Normal range of motion, supple, no masses SKIN: Skin is warm and dry. No rash noted. Not diaphoretic. No erythema. No pallor. NEUROLOGIC: Alert and oriented to person, place, and time. Normal reflexes, muscle tone coordination. No cranial nerve deficit noted. PSYCHIATRIC: Normal mood and affect. Normal behavior. Normal judgment and thought content. CARDIOVASCULAR: Normal heart rate noted, regular rhythm RESPIRATORY: Effort and breath sounds normal, no problems with respiration noted ABDOMEN: Soft, nondistended, diffusely tender with rebound and guarding PELVIC: Deferred MUSCULOSKELETAL: Normal range of motion. No edema and no tenderness. 2+ distal pulses.  Labs: Results for orders placed or performed during the hospital encounter of 05/09/22 (from the past 336 hour(s))  Type and screen   Collection Time: 05/09/22  9:22 AM  Result Value Ref Range   ABO/RH(D) PENDING    Antibody Screen PENDING    Sample Expiration      05/12/2022,2359 Performed at [facility], [place] [address], [place], [place] [address]   CBC   Collection Time: 05/09/22  9:23 AM  Result Value Ref Range   WBC 7.6 4.0 - 10.5 K/uL   RBC 4.42 3.87 - 5.11 MIL/uL   Hemoglobin 14.1 12.0 - 15.0 g/dL   HCT 41.3 36.0 - 46.0 %   MCV 93.4 80.0 - 100.0 fL   MCH 31.9  26.0 - 34.0 pg   MCHC 34.1 30.0 - 36.0 g/dL   RDW 12.1 11.5 - 15.5 %   Platelets 319 150 - 400 K/uL   nRBC 0.0 0.0 - 0.2 %  Results for orders placed or performed in visit on 05/08/22 (from the past 336 hour(s))  Beta HCG, Quant   Collection Time: 05/08/22  3:57 PM  Result Value Ref Range   hCG Quant 5,523 mIU/mL  Results for orders placed or performed in visit on 05/02/22 (from the past 336 hour(s))  POCT urine pregnancy   Collection Time: 05/02/22  8:43 AM  Result Value Ref Range   Preg Test, Ur Positive (A) Negative  Microscopic Examination   Collection Time: 05/02/22 12:26 PM   UC  Result Value Ref Range   WBC, UA None seen 0 - 5 /hpf   RBC, Urine 0-2 0 - 2 /hpf   Epithelial Cells (non renal) 0-10 0 - 10 /hpf   Casts None seen None seen /lpf   Bacteria, UA Few None seen/Few  Urine Culture, OB Reflex   Collection Time: 05/02/22 12:26 PM   UC  Result Value Ref Range   Organism ID, Bacteria Lactobacillus species   CBC/D/Plt+RPR+Rh+ABO+RubIgG...   Collection Time: 05/02/22 12:26 PM  Result Value Ref Range   Hepatitis B Surface Ag Negative Negative   HCV Ab Non Reactive Non Reactive   RPR Ser Ql Non Reactive Non Reactive   Rubella Antibodies, IGG 4.05 Immune >0.99 index   ABO Grouping A    Rh Factor Negative    Antibody Screen Negative Negative   HIV Screen 4th Generation wRfx Non Reactive Non Reactive   WBC 5.0 3.4 - 10.8 x10E3/uL   RBC 4.61 3.77 - 5.28 x10E6/uL   Hemoglobin 14.2 11.1 - 15.9 g/dL   Hematocrit 43.2 34.0 - 46.6 %   MCV 94 79 - 97 fL   MCH 30.8 26.6 - 33.0 pg   MCHC 32.9 31.5 - 35.7 g/dL   RDW 11.9 11.7 - 15.4 %   Platelets 331 150 - 450 x10E3/uL   Neutrophils 61 Not Estab. %   Lymphs 23 Not Estab. %   Monocytes 8 Not Estab. %   Eos 5 Not Estab. %   Basos 2 Not Estab. %   Neutrophils Absolute 3.1 1.4 - 7.0 x10E3/uL   Lymphocytes Absolute 1.2 0.7 - 3.1 x10E3/uL   Monocytes Absolute 0.4 0.1 - 0.9 x10E3/uL   EOS (ABSOLUTE) 0.2 0.0 - 0.4 x10E3/uL    Basophils Absolute 0.1 0.0 - 0.2 x10E3/uL   Immature Granulocytes 1 Not Estab. %   Immature Grans (Abs) 0.0 0.0 - 0.1 x10E3/uL   Specific Gravity, UA 1.013 1.005 - 1.030   pH, UA 6.5 5.0 - 7.5   Color, UA Yellow Yellow   Appearance Ur Clear Clear   Leukocytes,UA Trace (A)  Negative   Protein,UA Negative Negative/Trace   Glucose, UA Negative Negative   Ketones, UA Negative Negative   RBC, UA Negative Negative   Bilirubin, UA Negative Negative   Urobilinogen, Ur 0.2 0.2 - 1.0 mg/dL   Nitrite, UA Negative Negative   Microscopic Examination See below:    Urine Culture, OB Final report   Interpretation:   Collection Time: 05/02/22 12:26 PM  Result Value Ref Range   HCV Interp 1: Comment     Imaging Studies: US OB Transvaginal  Result Date: 05/09/2022 CLINICAL DATA:  32 year old female with a history of previous right side ectopic, positive pregnancy test and no IUP on ultrasound yesterday. New abdominal pain in the 1st trimester. Possible right tube region ectopic on exam yesterday. Follow-up quantitative beta HCG yesterday 5,523. EXAM: TRANSVAGINAL OB ULTRASOUND TECHNIQUE: Transvaginal ultrasound was performed for complete evaluation of the gestation as well as the maternal uterus, adnexal regions, and pelvic cul-de-sac. COMPARISON:  05/08/2022. FINDINGS: Intrauterine gestational sac: No normal. There does appear to be trace endometrial fluid on cine images of the left adnexa series 3, image 28. Maternal uterus/adnexae: Development of heterogeneous hematoma in the pelvis since the ultrasound yesterday, such that the the right ovary now is difficult to delineate. Moderate volume of blood overall. The left ovary is redemonstrated (2.1 x 2.4 x 1.9 cm) on image 14). Thick-walled cystic lesion in the right adnexa region seen on series 1 images 23 through 28 most resembles the right ovarian peripherally hypervascular finding yesterday (presumed corpus luteum). The more hypovascular right tube region  lesion is not re-demonstrated. IMPRESSION: 1. New moderate volume pelvic hematoma since the ultrasound yesterday, still with no IUP identified, compatible with ruptured ectopic pregnancy given beta HCG > 5,000. Right ovary or right tube region most likely. 2. EMR states planned urgent surgical management of ruptured ectopic by OBGYN Dr. Harolyn Rutherford. Electronically Signed   By: Genevie Ann M.D.   On: 05/09/2022 10:16   US OB LESS THAN 14 WEEKS WITH OB TRANSVAGINAL  Addendum Date: 05/08/2022   ADDENDUM REPORT: 05/08/2022 13:10 ADDENDUM: Study discussed by telephone with Dr. Orvis Brill (the patient's PCP) on 05/08/2022 at 13:02. I discussed with her that on re-review of the images with Lead Ultrasound Technologist Kim at [facility], I am now convinced that there are TWO separate right adnexal region thick-walled lesions, although the 2nd area - which is at the right tube near the cornu, separate from the ovary - does not demonstrate compelling vascularity on color Doppler (series 1, image 71). None the less, this elevates the likelihood of a right side Ectopic pregnancy. Dr. Owens Shark advises that this is a VERY desired pregnancy. And she had already discussed my report with OBGYN this morning in that context. I agree with their plan which is to measure serial quantitative beta HCG for several days, and repeat an Ultrasound as necessary. Electronically Signed   By: Genevie Ann M.D.   On: 05/08/2022 13:10   Result Date: 05/08/2022 CLINICAL DATA:  32 year old female with history of ectopic pregnancy in 2021. But asymptomatic currently, positive home pregnancy test, estimated gestational age by LMP now 7 weeks 1 day. No quantitative beta HCG obtained yet. EXAM: OBSTETRIC <14 WK [place] AND TRANSVAGINAL OB US TECHNIQUE: Both transabdominal and transvaginal ultrasound examinations were performed for complete evaluation of the gestation as well as the maternal uterus, adnexal regions, and pelvic cul-de-sac.  Transvaginal technique was performed to assess early pregnancy. COMPARISON:  Prior OB ultrasound 06/24/2019. FINDINGS:  Intrauterine gestational sac: None. Maternal uterus/adnexae: Hypertrophied but otherwise bland appearance of the endometrial stripe (up to 13 mm thickness series 1, images 50 and 54). The left ovary appears normal measuring 1.9 x 2.8 x 1.7 cm with multiple small follicles. The right ovarian complex is larger, all told up to nearly 4 cm by 2.1 cm by 2.6 cm. And there is a thick-walled cystic area within or adjacent to the ovary (series 1, images 75-77) with conspicuous peripheral hypervascularity (ring of fire). No fetal pole is visible. The remaining right ovarian parenchyma appears normal. No pelvis free fluid. IMPRESSION: 1. No IUP. Indeterminate appearance of the right ovary (possibly physiologic corpus luteum). No pelvis free fluid. 2. Differential considerations include failed IUP, early IUP, and right adnexal ectopic pregnancy. Recommend serial quantitative beta HCG and repeat Ultrasound. These results will be called to the ordering clinician or representative by the Radiology Department at the imaging location. Electronically Signed: By: Genevie Ann M.D. On: 05/08/2022 10:27    Assessment: Patient Active Problem List   Diagnosis Date Noted   Hemoperitoneum due to rupture of tubal ectopic pregnancy 05/09/2022    Plan: Patient will undergo urgent surgical management with ruptured ectopic pregnancy.  The risks of surgery were discussed in detail with the patient including but not limited to: bleeding which may require transfusion or reoperation; infection which may require antibiotics; injury to surrounding organs which may involve bowel, bladder, uterus, ureters; need for additional procedures including laparotomy or subsequent procedures secondary to abnormal pathology; need for close surveillance if there is no pathology concurrent with ectopic pregnancy (can happen if ectopic is aborted  via tube and it becomes part of the hemoperitoneum); thromboembolic phenomenon; surgical site problems and other postoperative/anesthesia complications. Likelihood of success in alleviating the patient's condition was discussed. Routine postoperative instructions will be reviewed with the patient and her family in detail after surgery. If she is stable, she will likely be discharged to home from PACU.  The patient concurred with the proposed plan, giving informed written consent for the surgery.  Patient has been NPO since last night and she will remain NPO for procedure.  Anesthesia and OR aware.  Preoperative prophylactic antibiotics and SCDs ordered on call to the OR.  To OR when ready.    Verita Schneiders, MD, [place] for [employer], [place]

## 2022-05-09 NOTE — Anesthesia Procedure Notes (Signed)
Procedure Name: Intubation Date/Time: 05/09/2022 10:41 AM  Performed by: Heide Scales, CRNAPre-anesthesia Checklist: Patient identified, Emergency Drugs available, Suction available and Patient being monitored Patient Re-evaluated:Patient Re-evaluated prior to induction Oxygen Delivery Method: Circle system utilized Preoxygenation: Pre-oxygenation with 100% oxygen Induction Type: IV induction, Rapid sequence and Cricoid Pressure applied Laryngoscope Size: Mac and 3 Grade View: Grade I Tube type: Oral Tube size: 7.0 mm Number of attempts: 1 Airway Equipment and Method: Stylet and Oral airway Placement Confirmation: ETT inserted through vocal cords under direct vision, positive ETCO2 and breath sounds checked- equal and bilateral Secured at: 22 cm Tube secured with: Tape Dental Injury: Teeth and Oropharynx as per pre-operative assessment

## 2022-05-09 NOTE — Op Note (Signed)
Theone Henk PROCEDURE DATE: 05/09/2022  PREOPERATIVE DIAGNOSIS: Ruptured ectopic pregnancy POSTOPERATIVE DIAGNOSIS: Ruptured right fallopian tube ectopic pregnancy PROCEDURE: Laparoscopic right salpingectomy and removal of ectopic pregnancy SURGEON:  Dr. Verita Schneiders ANESTHESIOLOGY TEAM: Anesthesiologist: Santa Lighter, MD CRNA: Flossie Dibble, CRNA; Heide Scales, CRNA  INDICATIONS: 32 y.o. G2P0010 at [redacted]w[redacted]d here with the preoperative diagnoses as listed above.  Please refer to preoperative notes for more details. Patient was counseled regarding need for laparoscopic salpingectomy. Risks of surgery including bleeding which may require transfusion or reoperation, infection, injury to bowel or other surrounding organs, need for additional procedures including laparotomy and other postoperative/anesthesia complications were explained to patient.  Written informed consent was obtained.  FINDINGS:  Moderate amount of hemoperitoneum estimated to be about 400 ml of blood and clots.  Dilated right fallopian tube containing ectopic gestation. Small normal appearing uterus, normal left fallopian tube, right ovary and left ovary.  ANESTHESIA: General ESTIMATED BLOOD LOSS: 5 ml during surgery but suctioned off over 300 ml of hemoperitoneum (about 100 ml left in peritoneal cavity covered by bowel, omentum etc) SPECIMENS: Right fallopian tube containing ectopic gestation COMPLICATIONS: None immediate  PROCEDURE IN DETAIL:  The patient was taken to the operating room where general anesthesia was administered and was found to be adequate.  She was placed in the dorsal lithotomy position, and was prepped and draped in a sterile manner.  A Foley catheter was inserted into her bladder and attached to constant drainage and a uterine manipulator was then advanced into the uterus .    After an adequate timeout was performed, attention was turned to the abdomen where an umbilical incision was made with the scalpel.   The Optiview 11-mm trocar and sleeve were then advanced without difficulty with the laparoscope under direct visualization into the abdomen.  The abdomen was then insufflated with carbon dioxide gas and adequate pneumoperitoneum was obtained.  A survey of the patient's pelvis and abdomen revealed the findings above.  Two 5-mm  left lower quadrant ports were then placed under direct visualization.  The Nezhat suction irrigator was then used to suction the hemoperitoneum and irrigate the pelvis.  Attention was then turned to the right fallopian tube which was grasped and ligated from the underlying mesosalpinx and uterine attachment using the Harmonic instrument.  Good hemostasis was noted.  The specimen was placed in an EndoCatch bag and removed from the abdomen intact.  The abdomen was desufflated, and all instruments were removed.  The fascial incision of the 11-mm site was reapproximated with a 0 Vicryl figure-of-eight stitch; and all skin incisions were closed with 4-0 Monocryl and Dermabond. The patient tolerated the procedure well.  Sponge, lap, and needle counts were correct times three.  The patient was then taken to the recovery room awake, extubated and in stable condition.   The patient will be discharged to home as per PACU criteria.  Routine postoperative instructions given.  She was prescribed Oxycodone Ibuprofen and Colace.  She will follow up in the office in about 2-3 weeks for postoperative evaluation.   Verita Schneiders, MD, [place] for [employer], [place]

## 2022-05-09 NOTE — Anesthesia Preprocedure Evaluation (Signed)
Anesthesia Evaluation  Patient identified by MRN, date of birth, ID band Patient awake    Reviewed: Allergy & Precautions, NPO status , Patient's Chart, lab work & pertinent test results  Airway Mallampati: II  TM Distance: >3 FB Neck ROM: Full    Dental  (+) Teeth Intact, Dental Advisory Given   Pulmonary neg pulmonary ROS   Pulmonary exam normal breath sounds clear to auscultation       Cardiovascular negative cardio ROS Normal cardiovascular exam Rhythm:Regular Rate:Normal     Neuro/Psych negative neurological ROS  negative psych ROS   GI/Hepatic negative GI ROS, Neg liver ROS,,,  Endo/Other  negative endocrine ROS    Renal/GU Renal disease     Musculoskeletal negative musculoskeletal ROS (+)    Abdominal   Peds  Hematology negative hematology ROS (+)   Anesthesia Other Findings Day of surgery medications reviewed with the patient.  Reproductive/Obstetrics Ectopic pregnancy                             Anesthesia Physical Anesthesia Plan  ASA: 2 and emergent  Anesthesia Plan: General   Post-op Pain Management: Tylenol PO (pre-op)*   Induction: Intravenous, Rapid sequence and Cricoid pressure planned  PONV Risk Score and Plan: 3 and Midazolam, Dexamethasone and Ondansetron  Airway Management Planned: Oral ETT  Additional Equipment:   Intra-op Plan:   Post-operative Plan: Extubation in OR  Informed Consent: I have reviewed the patients History and Physical, chart, labs and discussed the procedure including the risks, benefits and alternatives for the proposed anesthesia with the patient or authorized representative who has indicated his/her understanding and acceptance.     Dental advisory given  Plan Discussed with: CRNA  Anesthesia Plan Comments:        Anesthesia Quick Evaluation

## 2022-05-10 ENCOUNTER — Encounter (HOSPITAL_COMMUNITY): Payer: Self-pay | Admitting: Obstetrics & Gynecology

## 2022-05-10 LAB — RH IG WORKUP (INCLUDES ABO/RH)
Gestational Age(Wks): 6
Unit division: 0

## 2022-05-12 ENCOUNTER — Telehealth: Payer: Self-pay | Admitting: Obstetrics & Gynecology

## 2022-05-12 LAB — SURGICAL PATHOLOGY

## 2022-05-12 NOTE — Telephone Encounter (Signed)
Spoke with patient about her appointment made for 04/19 at 10:00 am with Dr. Harolyn Rutherford.

## 2022-05-13 ENCOUNTER — Encounter: Payer: Self-pay | Admitting: Student

## 2022-05-14 ENCOUNTER — Encounter: Payer: Commercial Managed Care - PPO | Admitting: Family Medicine

## 2022-05-26 ENCOUNTER — Encounter: Payer: Self-pay | Admitting: Obstetrics & Gynecology

## 2022-05-26 ENCOUNTER — Ambulatory Visit (INDEPENDENT_AMBULATORY_CARE_PROVIDER_SITE_OTHER): Payer: Commercial Managed Care - PPO | Admitting: Obstetrics & Gynecology

## 2022-05-26 ENCOUNTER — Other Ambulatory Visit: Payer: Self-pay

## 2022-05-26 VITALS — BP 121/83 | HR 87 | Wt 117.9 lb

## 2022-05-26 DIAGNOSIS — Z09 Encounter for follow-up examination after completed treatment for conditions other than malignant neoplasm: Secondary | ICD-10-CM

## 2022-05-26 DIAGNOSIS — Z9889 Other specified postprocedural states: Secondary | ICD-10-CM

## 2022-05-26 NOTE — Progress Notes (Signed)
   POSTOPERATIVE VISIT  Subjective:     Emily Lin is a 32 y.o.  G80P0020 female who presents to the office status post  laparoscopic right salpingectomy and removal of ectopic pregnancy for ruptured right fallopian tube ectopic pregnancy on 05/09/22.  Eating a regular diet without difficulty. Bowel movements are normal. The patient is not having any pain.  The following portions of the patient's history were reviewed and updated as appropriate: allergies, current medications, past family history, past medical history, past social history, past surgical history, and problem list.  Review of Systems Pertinent items noted in HPI and remainder of comprehensive ROS otherwise negative.    Objective:    BP 121/83   Pulse 87   Wt 117 lb 14.4 oz (53.5 kg)   LMP 03/19/2022 (Exact Date)   Breastfeeding Unknown   BMI 20.89 kg/m  General:  alert and no distress  Abdomen: soft, bowel sounds active, non-tender  Incision:   healing well, no drainage, no erythema, no hernia, no seroma, no swelling, no dehiscence, incision well approximated    05/09/22 Surgical Pathology A. RIGHT FALLOPIAN TUBE, ECTOPIC PREGNANCY, SALPINGECTOMY:  Hemosalpinx with rupture and immature chorionic villi consistent with  ruptured ectopic tubal pregnancy.  Negative for hydropic villi and trophoblastic atypia    Assessment:    Doing well postoperatively. Operative findings again reviewed. Pathology report discussed.    Plan:    1. Continue any current medications. 2. Wound care discussed. 3. Activity restrictions: none 4. Anticipated return to work: not applicable. 5. Follow up: as needed.    Jaynie Collins, MD, FACOG Obstetrician & Gynecologist, [facility] for [employer], [facility]

## 2022-05-26 NOTE — Patient Instructions (Signed)
[facility] Providers   [facility]             [address], [place], [place] [address] [phone]  [facility]                                                             [address], [address], [place], [place], [address] [phone]  [facility]                                    [address], [address], [place], [place], [address] [phone]  [facility] [place] [address], [place], [place], [address] [phone]  [facility]                                 [address], [place], [place], [address] [phone]  [facility]                                    [address], [place], [place], [address] [phone]  [facility] [address], [address],  [place], [place], [address]                              [facility] [address], [place], [place], [address] [phone]  [facility]         Phone: [phone]  [facility]      Phone: [phone]   [facility]      Phone: [phone]  [facility]         Phone: [phone]   [facility]    Phone: [phone]  [facility]      Phone: [phone]  [facility]     Phone: [phone]  [facility]        Phone: [phone]  [facility] (Sheronette Cousins) [phone]  [facility]      Phone: [phone]   

## 2022-06-06 ENCOUNTER — Ambulatory Visit: Payer: Commercial Managed Care - PPO | Admitting: Obstetrics & Gynecology

## 2022-06-13 ENCOUNTER — Encounter: Payer: Self-pay | Admitting: Student

## 2022-06-18 ENCOUNTER — Ambulatory Visit: Payer: Commercial Managed Care - PPO | Admitting: Obstetrics & Gynecology

## 2022-11-24 ENCOUNTER — Encounter (HOSPITAL_BASED_OUTPATIENT_CLINIC_OR_DEPARTMENT_OTHER): Payer: Self-pay | Admitting: Obstetrics and Gynecology

## 2022-11-25 ENCOUNTER — Encounter (HOSPITAL_BASED_OUTPATIENT_CLINIC_OR_DEPARTMENT_OTHER): Payer: Self-pay | Admitting: Obstetrics and Gynecology

## 2022-11-25 NOTE — Progress Notes (Signed)
Spoke w/ via phone for pre-op interview--- pt Lab needs dos----   cbc, urine preg      Lab results------ no COVID test -----patient states asymptomatic no test needed Arrive at ------- 0830 on 12-04-2022  NPO after MN NO Solid Food.  Clear liquids from MN until--- 0730 Med rec completed Medications to take morning of surgery ----- synthroid Diabetic medication ----- n/a Patient instructed no nail polish to be worn day of surgery Patient instructed to bring photo id and insurance card day of surgery Patient aware to have Driver (ride ) / caregiver    for 24 hours after surgery - husband, david Patient Special Instructions ----- n/a Pre-Op special Instructions ----- n/a Patient verbalized understanding of instructions that were given at this phone interview. Patient denies chest pain, sob, fever, cough at the interview.

## 2022-12-04 ENCOUNTER — Ambulatory Visit (HOSPITAL_BASED_OUTPATIENT_CLINIC_OR_DEPARTMENT_OTHER)
Admission: RE | Admit: 2022-12-04 | Payer: Commercial Managed Care - PPO | Source: Home / Self Care | Admitting: Obstetrics and Gynecology

## 2022-12-04 DIAGNOSIS — Z8759 Personal history of other complications of pregnancy, childbirth and the puerperium: Secondary | ICD-10-CM

## 2022-12-04 DIAGNOSIS — Z01818 Encounter for other preprocedural examination: Secondary | ICD-10-CM

## 2022-12-04 HISTORY — DX: Personal history of other complications of pregnancy, childbirth and the puerperium: Z87.59

## 2022-12-04 HISTORY — DX: Unspecified ectopic pregnancy without intrauterine pregnancy: O00.90

## 2022-12-04 HISTORY — DX: Hypothyroidism, unspecified: E03.9

## 2022-12-04 HISTORY — DX: Personal history of urinary calculi: Z87.442

## 2022-12-04 SURGERY — SALPINGECTOMY, BILATERAL, LAPAROSCOPIC
Anesthesia: General | Laterality: Left

## 2023-02-13 ENCOUNTER — Inpatient Hospital Stay (HOSPITAL_COMMUNITY): Payer: Commercial Managed Care - PPO

## 2023-02-13 ENCOUNTER — Inpatient Hospital Stay (HOSPITAL_COMMUNITY)
Admission: AD | Admit: 2023-02-13 | Discharge: 2023-02-14 | Disposition: A | Discharge status: Home / Self Care | Payer: Commercial Managed Care - PPO | Attending: Obstetrics and Gynecology | Admitting: Obstetrics and Gynecology

## 2023-02-13 ENCOUNTER — Encounter (HOSPITAL_COMMUNITY): Payer: Self-pay | Admitting: *Deleted

## 2023-02-13 DIAGNOSIS — M25511 Pain in right shoulder: Secondary | ICD-10-CM | POA: Diagnosis not present

## 2023-02-13 DIAGNOSIS — O99281 Endocrine, nutritional and metabolic diseases complicating pregnancy, first trimester: Secondary | ICD-10-CM | POA: Diagnosis not present

## 2023-02-13 DIAGNOSIS — M545 Low back pain, unspecified: Secondary | ICD-10-CM | POA: Diagnosis not present

## 2023-02-13 DIAGNOSIS — O26891 Other specified pregnancy related conditions, first trimester: Secondary | ICD-10-CM | POA: Diagnosis not present

## 2023-02-13 DIAGNOSIS — Z8759 Personal history of other complications of pregnancy, childbirth and the puerperium: Secondary | ICD-10-CM | POA: Diagnosis not present

## 2023-02-13 DIAGNOSIS — Z3491 Encounter for supervision of normal pregnancy, unspecified, first trimester: Secondary | ICD-10-CM

## 2023-02-13 DIAGNOSIS — R109 Unspecified abdominal pain: Secondary | ICD-10-CM | POA: Diagnosis not present

## 2023-02-13 DIAGNOSIS — Z3A01 Less than 8 weeks gestation of pregnancy: Secondary | ICD-10-CM | POA: Insufficient documentation

## 2023-02-13 DIAGNOSIS — O09291 Supervision of pregnancy with other poor reproductive or obstetric history, first trimester: Secondary | ICD-10-CM | POA: Diagnosis not present

## 2023-02-13 LAB — URINALYSIS, ROUTINE W REFLEX MICROSCOPIC
Bilirubin Urine: NEGATIVE
Glucose, UA: NEGATIVE mg/dL
Hgb urine dipstick: NEGATIVE
Ketones, ur: NEGATIVE mg/dL
Leukocytes,Ua: NEGATIVE
Nitrite: NEGATIVE
Protein, ur: NEGATIVE mg/dL
Specific Gravity, Urine: 1.005 (ref 1.005–1.030)
pH: 6 (ref 5.0–8.0)

## 2023-02-13 LAB — WET PREP, GENITAL
Clue Cells Wet Prep HPF POC: NONE SEEN
Sperm: NONE SEEN
Trich, Wet Prep: NONE SEEN
WBC, Wet Prep HPF POC: <10 (ref <10)
Yeast Wet Prep HPF POC: NONE SEEN

## 2023-02-13 LAB — HCG, QUANTITATIVE, PREGNANCY: hCG, Beta Chain, Quant, S: 4262 m[IU]/mL — ABNORMAL HIGH (ref <5)

## 2023-02-13 LAB — POCT PREGNANCY, URINE: Preg Test, Ur: POSITIVE — AB

## 2023-02-13 NOTE — MAU Note (Signed)
Pt says she had an U/S on Monday - sch- to rule out ectopic  at [facility], NP - U/S says 5 weeks and 1 day .  Today started pain in right lower back , and twinge in  right shoulder and right abd . Now pain- 2/10 over all  No VB

## 2023-02-14 DIAGNOSIS — O26891 Other specified pregnancy related conditions, first trimester: Secondary | ICD-10-CM

## 2023-02-14 DIAGNOSIS — R109 Unspecified abdominal pain: Secondary | ICD-10-CM

## 2023-02-14 DIAGNOSIS — Z3A01 Less than 8 weeks gestation of pregnancy: Secondary | ICD-10-CM

## 2023-02-14 DIAGNOSIS — Z8759 Personal history of other complications of pregnancy, childbirth and the puerperium: Secondary | ICD-10-CM

## 2023-02-14 NOTE — MAU Provider Note (Signed)
Chief Complaint: Abdominal Pain and Shoulder Pain   None     SUBJECTIVE HPI: Emily Lin is a 32 y.o. G3P0020 at [redacted]w[redacted]d by LMP who presents to maternity admissions reporting pain in lower back, twinge in right shoulder and right abdomen.  She has hx ectopic x 2 with right salpingectomy 04/2022 and was concerned about her symptoms.    She denies vaginal bleeding, vaginal itching/burning, urinary symptoms, h/a, dizziness, n/v, or fever/chills.    Pt started care at [facility] office and had appropriate hcg rise last week with last hcg 327 on 05/06/22.  US showed gestational sac only, no yolk sac per pt.      HPI  Past Medical History:  Diagnosis Date   Ectopic pregnancy    recurrent, left side   History of ectopic pregnancy    06-13-2019  right side , treated w/ methotrexate;    recurrent right side ruptured s/p right salpigectomy , removal of pregnancy 05-09-2022   History of kidney stones    Hypothyroidism    followed by gyn   Past Surgical History:  Procedure Laterality Date   APPENDECTOMY  2009   DIAGNOSTIC LAPAROSCOPY WITH REMOVAL OF ECTOPIC PREGNANCY N/A 05/09/2022   Procedure: DIAGNOSTIC LAPAROSCOPY WITH REMOVAL OF ECTOPIC PREGNANCY;  Surgeon: Tereso Newcomer, MD;  Location: [facility] OR;  Service: Gynecology;  Laterality: N/A;   EXTRACORPOREAL SHOCK WAVE LITHOTRIPSY  2004   LAPAROSCOPIC UNILATERAL SALPINGECTOMY Right 05/09/2022   Procedure: LAPAROSCOPIC RIGHT SALPINGECTOMY;  Surgeon: Tereso Newcomer, MD;  Location: [facility] OR;  Service: Gynecology;  Laterality: Right;   WISDOM TOOTH EXTRACTION Bilateral    Social History   Socioeconomic History   Marital status: Married    Spouse name: Not on file   Number of children: Not on file   Years of education: Not on file   Highest education level: Not on file  Occupational History   Not on file  Tobacco Use   Smoking status: Never   Smokeless tobacco: Never  Vaping Use   Vaping status: Never Used  Substance and Sexual  Activity   Alcohol use: Not Currently    Comment: Socially   Drug use: Never   Sexual activity: Yes    Birth control/protection: None  Other Topics Concern   Not on file  Social History Narrative   Not on file   Social Drivers of Health   Financial Resource Strain: Not on file  Food Insecurity: Not on file  Transportation Needs: Not on file  Physical Activity: Not on file  Stress: Not on file  Social Connections: Not on file  Intimate Partner Violence: Not on file   No current facility-administered medications on file prior to encounter.   Current Outpatient Medications on File Prior to Encounter  Medication Sig Dispense Refill   Cholecalciferol (VITAMIN D3) 50 MCG (2000 UT) capsule Take 2,000 Units by mouth daily.     Coenzyme Q10 (COQ-10) 100 MG CAPS Take 1 capsule by mouth daily.     levothyroxine (SYNTHROID) 25 MCG tablet Take 25 mcg by mouth daily before breakfast.     prenatal vitamin w/FE, FA (PRENATAL 1 + 1) 27-1 MG TABS tablet Take 1 tablet by mouth daily at 12 noon.     Probiotic Product (PROBIOTIC DAILY) CAPS Take 1 capsule by mouth daily.     No Known Allergies  ROS:  Review of Systems  Constitutional:  Negative for chills, fatigue and fever.  Respiratory:  Negative for shortness of breath.  Cardiovascular:  Negative for chest pain.  Gastrointestinal:  Positive for abdominal pain.  Genitourinary:  Negative for difficulty urinating, dysuria, flank pain, pelvic pain, vaginal bleeding, vaginal discharge and vaginal pain.  Musculoskeletal:        Shoulder pain  Neurological:  Negative for dizziness and headaches.  Psychiatric/Behavioral: Negative.       I have reviewed patient's Past Medical Hx, Surgical Hx, Family Hx, Social Hx, medications and allergies.   Physical Exam  Patient Vitals for the past 24 hrs:  BP Temp Temp src Pulse Resp SpO2 Height Weight  02/14/23 0202 -- -- -- -- 14 -- -- --  02/13/23 2141 105/71 98.9 F (37.2 C) Oral 85 16 100 % 5\' 3"   (1.6 m) 55.8 kg   Constitutional: Well-developed, well-nourished female in no acute distress.  Cardiovascular: normal rate Respiratory: normal effort GI: Abd soft, non-tender. Pos BS x 4 MS: Extremities nontender, no edema, normal ROM Neurologic: Alert and oriented x 4.  GU: Neg CVAT.  PELVIC EXAM: self swabs collected   LAB RESULTS Results for orders placed or performed during the hospital encounter of 02/13/23 (from the past 24 hours)  Pregnancy, urine POC     Status: Abnormal   Collection Time: 02/13/23  9:10 PM  Result Value Ref Range   Preg Test, Ur POSITIVE (A) NEGATIVE  Urinalysis, Routine w reflex microscopic -Urine, Clean Catch     Status: Abnormal   Collection Time: 02/13/23  9:12 PM  Result Value Ref Range   Color, Urine STRAW (A) YELLOW   APPearance CLEAR CLEAR   Specific Gravity, Urine 1.005 1.005 - 1.030   pH 6.0 5.0 - 8.0   Glucose, UA NEGATIVE NEGATIVE mg/dL   Hgb urine dipstick NEGATIVE NEGATIVE   Bilirubin Urine NEGATIVE NEGATIVE   Ketones, ur NEGATIVE NEGATIVE mg/dL   Protein, ur NEGATIVE NEGATIVE mg/dL   Nitrite NEGATIVE NEGATIVE   Leukocytes,Ua NEGATIVE NEGATIVE   RBC / HPF 0-5 0 - 5 RBC/hpf   WBC, UA 0-5 0 - 5 WBC/hpf   Bacteria, UA RARE (A) NONE SEEN   Squamous Epithelial / HPF 0-5 0 - 5 /HPF  Wet prep, genital     Status: None   Collection Time: 02/13/23  9:50 PM  Result Value Ref Range   Yeast Wet Prep HPF POC NONE SEEN NONE SEEN   Trich, Wet Prep NONE SEEN NONE SEEN   Clue Cells Wet Prep HPF POC NONE SEEN NONE SEEN   WBC, Wet Prep HPF POC <10 <10   Sperm NONE SEEN   hCG, quantitative, pregnancy     Status: Abnormal   Collection Time: 02/13/23  9:57 PM  Result Value Ref Range   hCG, Beta Chain, Quant, S 4,262 (H) <5 mIU/mL    --/--/A NEG (03/22 0922)  IMAGING US OB LESS THAN 14 WEEKS WITH OB TRANSVAGINAL Result Date: 02/13/2023 CLINICAL DATA:  Pelvic pain with positive pregnancy test, initial encounter EXAM: OBSTETRIC <14 WK [place] AND  TRANSVAGINAL OB US TECHNIQUE: Both transabdominal and transvaginal ultrasound examinations were performed for complete evaluation of the gestation as well as the maternal uterus, adnexal regions, and pelvic cul-de-sac. Transvaginal technique was performed to assess early pregnancy. COMPARISON:  None Available. FINDINGS: Intrauterine gestational sac: Present Yolk sac:  Absent Embryo:  Absent MSD: 6.5 mm   5 w   2 d Subchorionic hemorrhage:  None visualized. Maternal uterus/adnexae: Left ovary appears within normal limits. Corpus luteum cyst is noted on the right. IMPRESSION: Probable early intrauterine gestational  sac, but no yolk sac, fetal pole, or cardiac activity yet visualized. Recommend follow-up quantitative B-HCG levels and follow-up US in 14 days to assess viability. This recommendation follows SRU consensus guidelines: Diagnostic Criteria for Nonviable Pregnancy Early in the First Trimester. Malva Limes Med 2013; 161:0960-45. Electronically Signed   By: Alcide Clever M.D.   On: 02/13/2023 22:20    MAU Management/MDM: Orders Placed This Encounter  Procedures   Wet prep, genital   US OB LESS THAN 14 WEEKS WITH OB TRANSVAGINAL   Urinalysis, Routine w reflex microscopic -Urine, Clean Catch   hCG, quantitative, pregnancy   Pregnancy, urine POC   Discharge patient    No orders of the defined types were placed in this encounter.   Gestational sac on today's [place] most likely IUP but given pt history, plan to watch closely. Pt pain is minimal today, 2/10.  There is a corpus luteal cyst on the right ovary on today's imaging.  Consult Dr Despina Hidden, who is in OR at the time of the call.  I called Dr Reina Fuse and discussed pt assessmet and findings.  Per Dr Reina Fuse, pt to keep appt on Monday, 12/30, for [place] but return to MAU sooner if symptoms worsen.     ASSESSMENT 1. Normal IUP (intrauterine pregnancy) on prenatal ultrasound, first trimester   2. Abdominal pain during pregnancy in first trimester   3. History  of ectopic pregnancy     PLAN Discharge home Allergies as of 02/14/2023   No Known Allergies      Medication List     TAKE these medications    CoQ-10 100 MG Caps Take 1 capsule by mouth daily.   levothyroxine 25 MCG tablet Commonly known as: SYNTHROID Take 25 mcg by mouth daily before breakfast.   prenatal vitamin w/FE, FA 27-1 MG Tabs tablet Take 1 tablet by mouth daily at 12 noon.   Probiotic Daily Caps Take 1 capsule by mouth daily.   Vitamin D3 50 MCG (2000 UT) capsule Take 2,000 Units by mouth daily.        Follow-up Information     [facility] Follow up.   Why: As scheduled Contact information: [address]  [address] [place] [address] [phone]         [facility] Follow up.   Specialty: Obstetrics and Gynecology Why: As needed for emergencies Contact information: [address] [place] [address] [phone]                Sharen Counter Certified Nurse-Midwife 02/14/2023  2:11 PM

## 2023-02-16 LAB — GC/CHLAMYDIA PROBE AMP (~~LOC~~) NOT AT ARMC
Chlamydia: NEGATIVE
Comment: NEGATIVE
Comment: NORMAL
Neisseria Gonorrhea: NEGATIVE

## 2023-02-18 NOTE — L&D Delivery Note (Signed)
 Delivery Note Pt pushed for about . An episiotomy was cut given retal heart hrate deceleration and tight perineum to effect quick delivery and at 7:24 PM a viable female was delivered via Vaginal, Spontaneous (Presentation:   Occiput Anterior).  APGAR: 8, 9; weight pending . Nuchal x 1 was reduced over infants head at perineum. Anterior and posterior shoulders delivered with no difficulty with next two pushes; body easily followed. Cord was clamped and cut after a minute delay. Cord blood obtained.  Placenta status: Spontaneous, Intact.  Cord: 3 vessels with the following complications: None.  Cord pH: n/a  Anesthesia: Epidural Episiotomy: None;Median Lacerations: 2nd degree;Perineal Suture Repair: 2.0 vicryl rapide and 4-0 vicryl Est. Blood Loss (mL):  approx - pending official calculation  Mom to postpartum.  Baby to Couplet care / Skin to Skin. They are unsure if want a circumcision   Ted LELON Solo 12/13/2023, 7:45 PM

## 2023-04-16 ENCOUNTER — Ambulatory Visit (INDEPENDENT_AMBULATORY_CARE_PROVIDER_SITE_OTHER): Payer: Commercial Managed Care - PPO | Admitting: Student

## 2023-04-16 VITALS — BP 104/69 | HR 61 | Ht 63.0 in | Wt 126.0 lb

## 2023-04-16 DIAGNOSIS — Z0000 Encounter for general adult medical examination without abnormal findings: Secondary | ICD-10-CM | POA: Diagnosis not present

## 2023-04-16 DIAGNOSIS — E559 Vitamin D deficiency, unspecified: Secondary | ICD-10-CM | POA: Diagnosis not present

## 2023-04-16 DIAGNOSIS — E039 Hypothyroidism, unspecified: Secondary | ICD-10-CM

## 2023-04-16 LAB — POCT GLYCOSYLATED HEMOGLOBIN (HGB A1C): Hemoglobin A1C: 4.8 % (ref 4.0–5.6)

## 2023-04-16 NOTE — Progress Notes (Signed)
    SUBJECTIVE:   Chief compliant/HPI: annual examination  Emily Lin is a 33 y.o. who presents today for an annual exam.   Review of systems form notable for negative.   Updated history tabs and problem list.   She recently had a miscarriage, says she is coping well.  She and her husband are doing okay. Continuing still working Herbalist. Had a good time at [facility] last weekend, her parents were able to visit for family reunion.   OBJECTIVE:   BP 104/69   Pulse 61   Ht 5\' 3"  (1.6 m)   Wt 126 lb (57.2 kg)   SpO2 100%   Breastfeeding Unknown   BMI 22.32 kg/m    General: NAD, well appearing Cardiac: RRR Neuro: A&O Respiratory: normal WOB on RA. No wheezing or crackles on auscultation, good lung sounds throughout Extremities: Moving all 4 extremities equally   ASSESSMENT/PLAN:   Assessment & Plan Hypothyroidism, unspecified type Continue levothyroxine 25 mcg daily. TSH within normal range today. Vitamin D deficiency Vitamin D level within normal limits today   Annual Examination  See AVS for age appropriate recommendations.   PHQ score 0, reviewed and discussed. Blood pressure reviewed and at goal.  Asked about intimate partner violence and patient reports none.  The patient currently uses nothing for contraception. Folate recommended as appropriate, minimum of 400 mcg per day.    Discussed family history, BRCA testing not indicated. Cervical cancer screening: prior Pap reviewed, repeat due in 2027 Immunizations up-to-date  Follow up in 1  year or sooner if indicated.    Darral Dash, DO [facility]

## 2023-04-16 NOTE — Patient Instructions (Signed)
 Great to see you Emily Lin  Enjoy this warm weather. Checking blood work today- will call if need med adjustment/if abnormal findings  Have a wonderful day,  Dr. Darral Dash [facility] [phone]

## 2023-04-17 ENCOUNTER — Encounter: Payer: Self-pay | Admitting: Student

## 2023-04-17 DIAGNOSIS — E039 Hypothyroidism, unspecified: Secondary | ICD-10-CM | POA: Insufficient documentation

## 2023-04-17 LAB — COMPREHENSIVE METABOLIC PANEL
ALT: 14 [IU]/L (ref 0–32)
AST: 18 [IU]/L (ref 0–40)
Albumin: 4.5 g/dL (ref 4.0–5.0)
Alkaline Phosphatase: 51 [IU]/L (ref 44–121)
BUN/Creatinine Ratio: 16 (ref 9–23)
BUN: 11 mg/dL (ref 6–20)
Bilirubin Total: 0.4 mg/dL (ref 0.0–1.2)
CO2: 21 mmol/L (ref 20–29)
Calcium: 9.5 mg/dL (ref 8.7–10.2)
Chloride: 102 mmol/L (ref 96–106)
Creatinine, Ser: 0.67 mg/dL (ref 0.57–1.00)
Globulin, Total: 2.2 g/dL (ref 1.5–4.5)
Glucose: 74 mg/dL (ref 70–99)
Potassium: 4.3 mmol/L (ref 3.5–5.2)
Sodium: 137 mmol/L (ref 134–144)
Total Protein: 6.7 g/dL (ref 6.0–8.5)
eGFR: 121 mL/min/{1.73_m2} (ref >59)

## 2023-04-17 LAB — CBC
Hematocrit: 41.7 % (ref 34.0–46.6)
Hemoglobin: 13.8 g/dL (ref 11.1–15.9)
MCH: 30.9 pg (ref 26.6–33.0)
MCHC: 33.1 g/dL (ref 31.5–35.7)
MCV: 94 fL (ref 79–97)
Platelets: 315 10*3/uL (ref 150–450)
RBC: 4.46 x10E6/uL (ref 3.77–5.28)
RDW: 11.9 % (ref 11.7–15.4)
WBC: 9 10*3/uL (ref 3.4–10.8)

## 2023-04-17 LAB — TSH RFX ON ABNORMAL TO FREE T4: TSH: 2.96 u[IU]/mL (ref 0.450–4.500)

## 2023-04-17 LAB — VITAMIN D 25 HYDROXY (VIT D DEFICIENCY, FRACTURES): Vit D, 25-Hydroxy: 46.8 ng/mL (ref 30.0–100.0)

## 2023-04-17 NOTE — Assessment & Plan Note (Addendum)
 Continue levothyroxine 25 mcg daily. TSH within normal range today.

## 2023-05-05 LAB — OB RESULTS CONSOLE RPR: RPR: NONREACTIVE

## 2023-05-05 LAB — OB RESULTS CONSOLE HIV ANTIBODY (ROUTINE TESTING): HIV: NONREACTIVE

## 2023-05-05 LAB — OB RESULTS CONSOLE ANTIBODY SCREEN: Antibody Screen: NEGATIVE

## 2023-05-05 LAB — OB RESULTS CONSOLE HEPATITIS B SURFACE ANTIGEN: Hepatitis B Surface Ag: NEGATIVE

## 2023-05-05 LAB — OB RESULTS CONSOLE GC/CHLAMYDIA
Chlamydia: NEGATIVE
Neisseria Gonorrhea: NEGATIVE

## 2023-05-05 LAB — HEPATITIS C ANTIBODY: HCV Ab: NEGATIVE

## 2023-05-05 LAB — OB RESULTS CONSOLE RUBELLA ANTIBODY, IGM: Rubella: IMMUNE

## 2023-07-28 ENCOUNTER — Encounter: Payer: Self-pay | Admitting: *Deleted

## 2023-11-09 LAB — OB RESULTS CONSOLE GBS: GBS: NEGATIVE

## 2023-12-09 ENCOUNTER — Encounter (HOSPITAL_COMMUNITY): Payer: Self-pay | Admitting: *Deleted

## 2023-12-09 ENCOUNTER — Telehealth (HOSPITAL_COMMUNITY): Payer: Self-pay | Admitting: *Deleted

## 2023-12-09 NOTE — Telephone Encounter (Signed)
 Preadmission screen

## 2023-12-13 ENCOUNTER — Inpatient Hospital Stay (HOSPITAL_COMMUNITY)
Admission: RE | Admit: 2023-12-13 | Discharge: 2023-12-15 | DRG: 807 | Disposition: A | Discharge status: Home / Self Care | Attending: Obstetrics and Gynecology | Admitting: Obstetrics and Gynecology

## 2023-12-13 ENCOUNTER — Other Ambulatory Visit: Payer: Self-pay

## 2023-12-13 ENCOUNTER — Inpatient Hospital Stay (HOSPITAL_COMMUNITY): Admitting: Anesthesiology

## 2023-12-13 ENCOUNTER — Inpatient Hospital Stay (HOSPITAL_COMMUNITY)

## 2023-12-13 ENCOUNTER — Encounter (HOSPITAL_COMMUNITY): Payer: Self-pay | Admitting: Obstetrics and Gynecology

## 2023-12-13 DIAGNOSIS — R519 Headache, unspecified: Secondary | ICD-10-CM | POA: Diagnosis not present

## 2023-12-13 DIAGNOSIS — Z3A41 41 weeks gestation of pregnancy: Secondary | ICD-10-CM | POA: Diagnosis not present

## 2023-12-13 DIAGNOSIS — O26893 Other specified pregnancy related conditions, third trimester: Secondary | ICD-10-CM | POA: Diagnosis present

## 2023-12-13 DIAGNOSIS — Z6791 Unspecified blood type, Rh negative: Secondary | ICD-10-CM

## 2023-12-13 DIAGNOSIS — O99284 Endocrine, nutritional and metabolic diseases complicating childbirth: Secondary | ICD-10-CM | POA: Diagnosis present

## 2023-12-13 DIAGNOSIS — R200 Anesthesia of skin: Secondary | ICD-10-CM | POA: Diagnosis not present

## 2023-12-13 DIAGNOSIS — Z8759 Personal history of other complications of pregnancy, childbirth and the puerperium: Principal | ICD-10-CM

## 2023-12-13 DIAGNOSIS — O480 Post-term pregnancy: Principal | ICD-10-CM | POA: Diagnosis present

## 2023-12-13 DIAGNOSIS — E038 Other specified hypothyroidism: Secondary | ICD-10-CM | POA: Diagnosis present

## 2023-12-13 DIAGNOSIS — M542 Cervicalgia: Secondary | ICD-10-CM | POA: Diagnosis not present

## 2023-12-13 LAB — TYPE AND SCREEN
ABO/RH(D): A NEG
Antibody Screen: NEGATIVE

## 2023-12-13 LAB — CBC
HCT: 36.1 % (ref 36.0–46.0)
Hemoglobin: 12 g/dL (ref 12.0–15.0)
MCH: 29 pg (ref 26.0–34.0)
MCHC: 33.2 g/dL (ref 30.0–36.0)
MCV: 87.2 fL (ref 80.0–100.0)
Platelets: 215 K/uL (ref 150–400)
RBC: 4.14 MIL/uL (ref 3.87–5.11)
RDW: 15.5 % (ref 11.5–15.5)
WBC: 6.4 K/uL (ref 4.0–10.5)
nRBC: 0 % (ref 0.0–0.2)

## 2023-12-13 LAB — HIV ANTIBODY (ROUTINE TESTING W REFLEX): HIV Screen 4th Generation wRfx: NONREACTIVE

## 2023-12-13 LAB — RPR: RPR Ser Ql: NONREACTIVE

## 2023-12-13 MED ORDER — DIBUCAINE (PERIANAL) 1 % EX OINT: 1.0000 | TOPICAL_OINTMENT | CUTANEOUS | Status: DC | PRN

## 2023-12-13 MED ORDER — OXYTOCIN BOLUS FROM INFUSION
333.0000 mL | Freq: Once | INTRAVENOUS | Status: AC
  Administered 2023-12-13: 333 mL via INTRAVENOUS

## 2023-12-13 MED ORDER — OXYTOCIN-SODIUM CHLORIDE 30-0.9 UT/500ML-% IV SOLN
1.0000 m[IU]/min | INTRAVENOUS | Status: DC
  Administered 2023-12-13: 2 m[IU]/min via INTRAVENOUS
  Filled 2023-12-13: qty 500

## 2023-12-13 MED ORDER — LACTATED RINGERS AMNIOINFUSION: INTRAVENOUS | Status: DC

## 2023-12-13 MED ORDER — DIPHENHYDRAMINE HCL 50 MG/ML IJ SOLN: 12.5000 mg | INTRAMUSCULAR | Status: DC | PRN

## 2023-12-13 MED ORDER — MISOPROSTOL 25 MCG QUARTER TABLET
25.0000 ug | ORAL_TABLET | Freq: Once | ORAL | Status: AC
  Administered 2023-12-13: 25 ug via VAGINAL
  Filled 2023-12-13: qty 1

## 2023-12-13 MED ORDER — OXYTOCIN-SODIUM CHLORIDE 30-0.9 UT/500ML-% IV SOLN: 2.5000 [IU]/h | INTRAVENOUS | Status: DC | PRN

## 2023-12-13 MED ORDER — LEVOTHYROXINE SODIUM 50 MCG PO TABS
25.0000 ug | ORAL_TABLET | Freq: Every day | ORAL | Status: DC
Start: 2023-12-14 — End: 2023-12-15
  Administered 2023-12-14 – 2023-12-15 (×2): 25 ug via ORAL
  Filled 2023-12-13 (×3): qty 1

## 2023-12-13 MED ORDER — MISOPROSTOL 25 MCG QUARTER TABLET: 25.0000 ug | ORAL_TABLET | Freq: Once | ORAL | Status: DC

## 2023-12-13 MED ORDER — IBUPROFEN 600 MG PO TABS
600.0000 mg | ORAL_TABLET | Freq: Four times a day (QID) | ORAL | Status: DC
  Administered 2023-12-13 – 2023-12-15 (×6): 600 mg via ORAL
  Filled 2023-12-13 (×6): qty 1

## 2023-12-13 MED ORDER — COCONUT OIL OIL
1.0000 | TOPICAL_OIL | Status: DC | PRN
  Administered 2023-12-14: 1 via TOPICAL

## 2023-12-13 MED ORDER — OXYCODONE HCL 5 MG PO TABS: 5.0000 mg | ORAL_TABLET | ORAL | Status: DC | PRN

## 2023-12-13 MED ORDER — EPHEDRINE 5 MG/ML INJ: 10.0000 mg | INTRAVENOUS | Status: DC | PRN

## 2023-12-13 MED ORDER — MISOPROSTOL 25 MCG QUARTER TABLET
25.0000 ug | ORAL_TABLET | Freq: Once | ORAL | Status: AC
  Administered 2023-12-13: 25 ug via ORAL
  Filled 2023-12-13: qty 1

## 2023-12-13 MED ORDER — OXYCODONE-ACETAMINOPHEN 5-325 MG PO TABS: 2.0000 | ORAL_TABLET | ORAL | Status: DC | PRN

## 2023-12-13 MED ORDER — ACETAMINOPHEN 325 MG PO TABS: 650.0000 mg | ORAL_TABLET | ORAL | Status: DC | PRN

## 2023-12-13 MED ORDER — TETANUS-DIPHTH-ACELL PERTUSSIS 5-2-15.5 LF-MCG/0.5 IM SUSP: 0.5000 mL | Freq: Once | INTRAMUSCULAR | Status: DC

## 2023-12-13 MED ORDER — TERBUTALINE SULFATE 1 MG/ML IJ SOLN
0.2500 mg | Freq: Once | INTRAMUSCULAR | Status: AC | PRN
  Administered 2023-12-13: 0.25 mg via SUBCUTANEOUS
  Filled 2023-12-13: qty 1

## 2023-12-13 MED ORDER — ZOLPIDEM TARTRATE 5 MG PO TABS: 5.0000 mg | ORAL_TABLET | Freq: Every evening | ORAL | Status: DC | PRN

## 2023-12-13 MED ORDER — LACTATED RINGERS IV SOLN: 500.0000 mL | Freq: Once | INTRAVENOUS | Status: DC

## 2023-12-13 MED ORDER — PHENYLEPHRINE 80 MCG/ML (10ML) SYRINGE FOR IV PUSH (FOR BLOOD PRESSURE SUPPORT)
80.0000 ug | PREFILLED_SYRINGE | INTRAVENOUS | Status: DC | PRN
  Filled 2023-12-13: qty 10

## 2023-12-13 MED ORDER — OXYTOCIN-SODIUM CHLORIDE 30-0.9 UT/500ML-% IV SOLN: 2.5000 [IU]/h | INTRAVENOUS | Status: DC

## 2023-12-13 MED ORDER — LACTATED RINGERS IV SOLN
500.0000 mL | INTRAVENOUS | Status: DC | PRN
  Administered 2023-12-13: 500 mL via INTRAVENOUS

## 2023-12-13 MED ORDER — BENZOCAINE-MENTHOL 20-0.5 % EX AERO
1.0000 | INHALATION_SPRAY | CUTANEOUS | Status: DC | PRN
  Administered 2023-12-14: 1 via TOPICAL
  Filled 2023-12-13: qty 56

## 2023-12-13 MED ORDER — ONDANSETRON HCL 4 MG/2ML IJ SOLN: 4.0000 mg | INTRAMUSCULAR | Status: DC | PRN

## 2023-12-13 MED ORDER — OXYCODONE-ACETAMINOPHEN 5-325 MG PO TABS: 1.0000 | ORAL_TABLET | ORAL | Status: DC | PRN

## 2023-12-13 MED ORDER — WITCH HAZEL-GLYCERIN EX PADS: 1.0000 | MEDICATED_PAD | CUTANEOUS | Status: DC | PRN

## 2023-12-13 MED ORDER — SOD CITRATE-CITRIC ACID 500-334 MG/5ML PO SOLN: 30.0000 mL | ORAL | Status: DC | PRN

## 2023-12-13 MED ORDER — FENTANYL-BUPIVACAINE-NACL 0.5-0.125-0.9 MG/250ML-% EP SOLN
12.0000 mL/h | EPIDURAL | Status: DC | PRN
  Administered 2023-12-13: 12 mL/h via EPIDURAL
  Filled 2023-12-13: qty 250

## 2023-12-13 MED ORDER — SIMETHICONE 80 MG PO CHEW: 80.0000 mg | CHEWABLE_TABLET | ORAL | Status: DC | PRN

## 2023-12-13 MED ORDER — OXYCODONE HCL 5 MG PO TABS: 10.0000 mg | ORAL_TABLET | ORAL | Status: DC | PRN

## 2023-12-13 MED ORDER — ONDANSETRON HCL 4 MG/2ML IJ SOLN: 4.0000 mg | Freq: Four times a day (QID) | INTRAMUSCULAR | Status: DC | PRN

## 2023-12-13 MED ORDER — PHENYLEPHRINE 80 MCG/ML (10ML) SYRINGE FOR IV PUSH (FOR BLOOD PRESSURE SUPPORT): 80.0000 ug | PREFILLED_SYRINGE | INTRAVENOUS | Status: DC | PRN

## 2023-12-13 MED ORDER — EPHEDRINE 5 MG/ML INJ
10.0000 mg | INTRAVENOUS | Status: DC | PRN
Start: 2023-12-13 — End: 2023-12-13

## 2023-12-13 MED ORDER — LIDOCAINE HCL (PF) 1 % IJ SOLN: 30.0000 mL | INTRAMUSCULAR | Status: DC | PRN

## 2023-12-13 MED ORDER — PRENATAL MULTIVITAMIN CH
1.0000 | ORAL_TABLET | Freq: Every day | ORAL | Status: DC
  Administered 2023-12-14 – 2023-12-15 (×2): 1 via ORAL
  Filled 2023-12-13 (×2): qty 1

## 2023-12-13 MED ORDER — ONDANSETRON HCL 4 MG PO TABS: 4.0000 mg | ORAL_TABLET | ORAL | Status: DC | PRN

## 2023-12-13 MED ORDER — LACTATED RINGERS IV SOLN: INTRAVENOUS | Status: AC

## 2023-12-13 MED ORDER — LACTATED RINGERS IV SOLN: INTRAVENOUS | Status: DC

## 2023-12-13 MED ORDER — SENNOSIDES-DOCUSATE SODIUM 8.6-50 MG PO TABS
2.0000 | ORAL_TABLET | Freq: Every day | ORAL | Status: DC
  Administered 2023-12-14 – 2023-12-15 (×2): 2 via ORAL
  Filled 2023-12-13 (×2): qty 2

## 2023-12-13 MED ORDER — LIDOCAINE HCL (PF) 1 % IJ SOLN
INTRAMUSCULAR | Status: DC | PRN
  Administered 2023-12-13: 3 mL via EPIDURAL
  Administered 2023-12-13: 5 mL via EPIDURAL
  Administered 2023-12-13: 2 mL via EPIDURAL

## 2023-12-13 MED ORDER — DIPHENHYDRAMINE HCL 25 MG PO CAPS: 25.0000 mg | ORAL_CAPSULE | Freq: Four times a day (QID) | ORAL | Status: DC | PRN

## 2023-12-13 NOTE — Anesthesia Preprocedure Evaluation (Signed)
 Anesthesia Evaluation  Patient identified by MRN, date of birth, ID band Patient awake    Reviewed: Allergy & Precautions, Patient's Chart, lab work & pertinent test results  Airway Mallampati: II  TM Distance: >3 FB     Dental   Pulmonary neg pulmonary ROS   Pulmonary exam normal        Cardiovascular negative cardio ROS Normal cardiovascular exam     Neuro/Psych negative neurological ROS     GI/Hepatic negative GI ROS, Neg liver ROS,,,  Endo/Other  Hypothyroidism    Renal/GU negative Renal ROS     Musculoskeletal   Abdominal   Peds  Hematology negative hematology ROS (+)   Anesthesia Other Findings   Reproductive/Obstetrics (+) Pregnancy                              Anesthesia Physical Anesthesia Plan  ASA: 2  Anesthesia Plan: Epidural   Post-op Pain Management:    Induction:   PONV Risk Score and Plan: 2  Airway Management Planned: Natural Airway  Additional Equipment:   Intra-op Plan:   Post-operative Plan:   Informed Consent: I have reviewed the patients History and Physical, chart, labs and discussed the procedure including the risks, benefits and alternatives for the proposed anesthesia with the patient or authorized representative who has indicated his/her understanding and acceptance.       Plan Discussed with:   Anesthesia Plan Comments:         Anesthesia Quick Evaluation

## 2023-12-13 NOTE — H&P (Addendum)
 Emily Lin is a 33 y.o. female presenting at 53 0/7 weeks for eIOL.Her pregnancy has been complicated by  Subclinical hypothyroid - on levothyroidism Pt is Rh neg ( A neg) - FOB is O neg GBS neg. NIPT low risk OB History     Gravida  4   Para      Term      Preterm      AB  3   Living         SAB  1   IAB      Ectopic  2   Multiple      Live Births             Past Medical History:  Diagnosis Date  . Ectopic pregnancy    recurrent, left side  . History of ectopic pregnancy    06-13-2019  right side , treated w/ methotrexate ;    recurrent right side ruptured s/p right salpigectomy , removal of pregnancy 05-09-2022  . History of kidney stones   . Hypothyroidism    followed by gyn   Past Surgical History:  Procedure Laterality Date  . APPENDECTOMY  2009  . DIAGNOSTIC LAPAROSCOPY WITH REMOVAL OF ECTOPIC PREGNANCY N/A 05/09/2022   Procedure: DIAGNOSTIC LAPAROSCOPY WITH REMOVAL OF ECTOPIC PREGNANCY;  Surgeon: Herchel Gloris LABOR, MD;  Location: [facility] OR;  Service: Gynecology;  Laterality: N/A;  . EXTRACORPOREAL SHOCK WAVE LITHOTRIPSY  2004  . LAPAROSCOPIC UNILATERAL SALPINGECTOMY Right 05/09/2022   Procedure: LAPAROSCOPIC RIGHT SALPINGECTOMY;  Surgeon: Herchel Gloris LABOR, MD;  Location: [facility] OR;  Service: Gynecology;  Laterality: Right;  . WISDOM TOOTH EXTRACTION Bilateral    Family History: family history includes Breast cancer (age of onset: 8) in her maternal grandmother; Cancer in her maternal grandmother; Hypothyroidism in her mother; Transient ischemic attack in her father. Social History:  reports that she has never smoked. She has never used smokeless tobacco. She reports that she does not currently use alcohol. She reports that she does not use drugs.     Maternal Diabetes: No Genetic Screening: Normal Maternal Ultrasounds/Referrals: Normal Fetal Ultrasounds or other Referrals:  None Maternal Substance Abuse:  No Significant Maternal Medications:   None Significant Maternal Lab Results:  Group B Strep negative Number of Prenatal Visits:greater than 3 verified prenatal visits Maternal Vaccinations:RSV: Given during pregnancy >/=14 days ago, TDap, and Flu Other Comments:  None  Review of Systems  Constitutional:  Negative for activity change and fatigue.  Eyes:  Negative for photophobia and visual disturbance.  Respiratory:  Negative for chest tightness and shortness of breath.   Cardiovascular:  Positive for leg swelling. Negative for chest pain and palpitations.  Gastrointestinal:  Negative for abdominal pain.  Genitourinary:  Positive for pelvic pain. Negative for frequency.  Musculoskeletal:  Negative for back pain.  Neurological:  Negative for light-headedness and headaches.  Psychiatric/Behavioral:  The patient is not nervous/anxious.    Maternal Medical History:  Reason for admission: postdates  Contractions: Frequency: rare.   Perceived severity is mild.   Fetal activity: Perceived fetal activity is normal.   Prenatal complications: no prenatal complications Prenatal Complications - Diabetes: none.     Blood pressure 122/78, pulse 68, temperature 98 F (36.7 C), temperature source Oral, resp. rate 18, last menstrual period 01/03/2023, SpO2 100%, unknown if currently breastfeeding. Maternal Exam:  Uterine Assessment: Contraction strength is mild.  Contraction frequency is rare.  Abdomen: Patient reports generalized tenderness.  Estimated fetal weight is AGA.   Fetal presentation: vertex Introitus:  Normal vulva. Vulva is negative for condylomata and lesion.  Normal vagina.  Vagina is negative for condylomata.  Pelvis: adequate for delivery.   Cervix: Cervix evaluated by digital exam.     Fetal Exam Fetal Monitor Review: Baseline rate: 125.  Variability: moderate (6-25 bpm).   Pattern: accelerations present and no decelerations.   Fetal State Assessment: Category I - tracings are normal.   Physical  Exam Vitals and nursing note reviewed. Exam conducted with a chaperone present.  Constitutional:      Appearance: Normal appearance. She is normal weight.  Cardiovascular:     Rate and Rhythm: Normal rate.     Pulses: Normal pulses.  Pulmonary:     Effort: Pulmonary effort is normal.  Abdominal:     Tenderness: There is generalized abdominal tenderness.  Genitourinary:    General: Normal vulva.  Vulva is no lesion.  Musculoskeletal:        General: Normal range of motion.     Cervical back: Normal range of motion.  Skin:    General: Skin is warm and dry.     Capillary Refill: Capillary refill takes 2 to 3 seconds.  Neurological:     General: No focal deficit present.     Mental Status: She is alert.  Psychiatric:        Mood and Affect: Mood normal.        Behavior: Behavior normal.        Thought Content: Thought content normal.        Judgment: Judgment normal.     Prenatal labs: ABO, Rh:   Antibody: Negative (03/18 0000) Rubella: Immune (03/18 0000) RPR: Nonreactive (03/18 0000)  HBsAg: Negative (03/18 0000)  HIV: Non-reactive (03/18 0000)  GBS: Negative/-- (09/22 0000)   Assessment/Plan: 68bn [id number] female at 41 0/7 wks for IOL - Admit  - Induce with cytotec to ripen to start - GBS neg - Pain relief prn - Anticipate svd   Shernita Rabinovich W Ashani Pumphrey 12/13/2023, 1:10 AM

## 2023-12-13 NOTE — Lactation Note (Signed)
 This note was copied from a baby's chart. Lactation Consultation Note  Patient Name: Emily Lin Date: 12/13/2023 Age:31 hours Reason for consult: Initial assessment;Primapara;Term  P1. Mom BF in L&D, baby must had slipped off of nipple and suckled on areola d/t bruising on areola. LC assisted in latching. It hurt at first then was better. Mom stated he will bit some. Has wide opened flanged at this time. Discussed newborn feeding habits, STS, I&O, positioning, support, body alignment. Mom encouraged to feed baby 8-12 times/24 hours and with feeding cues. Encouraged to wake baby if hasn't cued in 3 hrs. Mom getting very sleepy while BF.  Worked w/dad on proper flange and how to wide latch. Praised parents for good feeding. Call for assistance as needed. Answered questions parents had.  Maternal Data Has patient been taught Hand Expression?: Yes Does the patient have breastfeeding experience prior to this delivery?: No  Feeding    LATCH Score Latch: Grasps breast easily, tongue down, lips flanged, rhythmical sucking.  Audible Swallowing: Spontaneous and intermittent  Type of Nipple: Everted at rest and after stimulation  Comfort (Breast/Nipple): Filling, red/small blisters or bruises, mild/mod discomfort (bruising to Rt. areola)  Hold (Positioning): Assistance needed to correctly position infant at breast and maintain latch.  LATCH Score: 8   Lactation Tools Discussed/Used    Interventions Interventions: Breast feeding basics reviewed;Assisted with latch;Skin to skin;Breast massage;Hand express;Breast compression;Adjust position;Support pillows;Position options;Education;LC Services brochure  Discharge Discharge Education: Outpatient recommendation Pump: DEBP (baby buda)  Consult Status Consult Status: Follow-up Date: 12/14/23 Follow-up type: In-patient    Emily Lin 12/13/2023, 10:33 PM

## 2023-12-13 NOTE — Progress Notes (Signed)
 Emily Lin is a 33 y.o. G4P0030 at [redacted]w[redacted]d  Subjective: Pt is appreciating painful contractions. Had spontaneous ROM about 2 hrs ago   Objective: BP 131/81   Pulse 67   Temp 98.1 F (36.7 C) (Oral)   Resp 19   Ht 5' 3 (1.6 m)   Wt 69.2 kg   LMP 01/03/2023 (Exact Date)   SpO2 100%   BMI 27.01 kg/m  I/O last 3 completed shifts: In: 1025.6 [I.V.:1025.6] Out: -  No intake/output data recorded.  FHT:  FHR: 120 bpm, variability: moderate,  accelerations:  Present,  decelerations:  Absent UC:   regular, every 1-2 minutes SVE:   Dilation: 3 Effacement (%): 90 Station: -2 Exam by:: Dr. Delana  Labs: Lab Results  Component Value Date   WBC 6.4 12/13/2023   HGB 12.0 12/13/2023   HCT 36.1 12/13/2023   MCV 87.2 12/13/2023   PLT 215 12/13/2023    Assessment / Plan: 33yo G4P0030 female at 83 0/7wks   Labor: s/p cytotec x 2, then SROM. Forebag noted - ruptured with clear fluid noted. Cervical change noted - active labor Preeclampsia:  n/a Fetal Wellbeing:  Category I Pain Control:  Labor support without medications I/D:  n/a Anticipated MOD:  NSVD  Ted LELON Delana, DO 12/13/2023, 9:28 AM

## 2023-12-13 NOTE — Progress Notes (Signed)
 Patient ID: Emily Lin, female   DOB: September 03, 1990, 33 y.o.   MRN: [id number] At the 4 hr mark following cytotec administration, pt was noted to be having rather prolonged contractions. A bolus of luid was given and during that time a few variable decels were noted - they then resolved. Variability was maintained.  Strip was monitored for and no recurrence noted. Baseline FHT 12o with accels noted cw cat 1 strip  I Pts cervix had remained at 1cm following 25mcg oral and vaginal cytotec.   Plan to give 25mcg oral cytotec again now and then place a foley balloon.   Continue with expectant mgmt

## 2023-12-13 NOTE — Anesthesia Procedure Notes (Signed)
 Epidural Patient location during procedure: OB Start time: 12/13/2023 9:50 AM End time: 12/13/2023 9:56 AM  Staffing Anesthesiologist: Epifanio Charleston, MD Performed: anesthesiologist   Preanesthetic Checklist Completed: patient identified, IV checked, site marked, risks and benefits discussed, surgical consent, monitors and equipment checked, pre-op evaluation and timeout performed  Epidural Patient position: sitting Prep: DuraPrep and site prepped and draped Patient monitoring: continuous pulse ox and blood pressure Approach: midline Location: L4-L5 Injection technique: LOR air  Needle:  Needle insertion depth: 4.5 cm Needle type: Tuohy  Needle gauge: 17 G Needle length: 9 cm and 9 Needle insertion depth: 4.5 cm Catheter type: closed end flexible Catheter size: 19 Gauge Catheter at skin depth: 9.5 cm Test dose: negative  Assessment Events: blood not aspirated, no cerebrospinal fluid, injection not painful, no injection resistance, no paresthesia and negative IV test

## 2023-12-13 NOTE — Progress Notes (Addendum)
 Patient ID: Emily Lin, female   DOB: 1990-10-13, 33 y.o.   MRN: [id number] Pt labored well with complete but with no urge to push.  After laboring down for about pushing was attempted but again, with great pain relief from epidural, maternal effort was not ideal  Cat 1 strip  Pt allowed to labor down for another 30-65mins Pit at   Anticipate svd

## 2023-12-14 LAB — CBC
HCT: 31.4 % — ABNORMAL LOW (ref 36.0–46.0)
Hemoglobin: 10.3 g/dL — ABNORMAL LOW (ref 12.0–15.0)
MCH: 29.2 pg (ref 26.0–34.0)
MCHC: 32.8 g/dL (ref 30.0–36.0)
MCV: 89 fL (ref 80.0–100.0)
Platelets: 173 K/uL (ref 150–400)
RBC: 3.53 MIL/uL — ABNORMAL LOW (ref 3.87–5.11)
RDW: 15.5 % (ref 11.5–15.5)
WBC: 13.7 K/uL — ABNORMAL HIGH (ref 4.0–10.5)
nRBC: 0 % (ref 0.0–0.2)

## 2023-12-14 NOTE — Lactation Note (Signed)
 This note was copied from a baby's chart. Lactation Consultation Note  Patient Name: Emily Lin Unijb'd Date: 12/14/2023 Age:31 hours Reason for consult: Follow-up assessment;Term;Infant weight loss (per dad recently attempted to feed , baby sleepy and LC noted mom to be sleeping . LC will F/U)   Maternal Data    Feeding Mother's Current Feeding Choice: Breast Milk     Consult Status Consult Status: Follow-up Date: 12/14/23 Follow-up type: In-patient    Rollene Caldron Laurena Valko 12/14/2023, 12:50 PM

## 2023-12-14 NOTE — Lactation Note (Signed)
 This note was copied from a baby's chart. Lactation Consultation Note  Patient Name: Emily Lin Date: 12/14/2023 Age:31 hours. P1  Reason for consult: Follow-up assessment;Term;Infant weight loss;Primapara;1st time breastfeeding;Breastfeeding assistance (3 % weight loss) Per dad baby last attempted to feed at 12 N and sleepy .  LC came back for a 2nd visit and checked the baby's diaper, small to med loose mec ,and had another stool med while changing him.  The baby also has a small to med spit up, bulb x 2.  LC placed the baby STS on the left breast and assisted to position mom and baby for football. Baby fed 10 mins with multiple swallows. Baby fell asleep, LC instructed mom to release the suction and the nipple was well rounded. Latch score 9  LC plan and recommendations:  Breast shells while awake between feedings to elongate the nipple / areola complex.  Feed with cues and by 3 hours STS after the baby has a diaper check.  Prior to latch, hand express, pre-pump with the hand pump and reverse pressure.  Latch with firm support. Football recommended until the baby is consistently latching. Per mom more comfortable with the latch.  Nipple Shield was not needed for the latch.   Maternal Data Has patient been taught Hand Expression?: Yes Does the patient have breastfeeding experience prior to this delivery?: No  Feeding Mother's Current Feeding Choice: Breast Milk  LATCH Score Latch: Grasps breast easily, tongue down, lips flanged, rhythmical sucking.  Audible Swallowing: Spontaneous and intermittent  Type of Nipple: Everted at rest and after stimulation  Comfort (Breast/Nipple): Soft / non-tender  Hold (Positioning): Assistance needed to correctly position infant at breast and maintain latch.  LATCH Score: 9   Lactation Tools Discussed/Used Tools: Shells;Pump;Flanges Nipple shield size: Other (comment) (NS not needed for latch) Flange Size: 18 Breast pump type:  Manual Pump Education: Milk Storage;Setup, frequency, and cleaning Reason for Pumping: Nipple Shield was not needed for the latch 10/ 27 at 1420  Interventions Interventions: Breast feeding basics reviewed;Assisted with latch;Skin to skin;Breast massage;Hand express;Pre-pump if needed;Reverse pressure;Breast compression;Adjust position;Support pillows;Position options;Shells;Hand pump;Education;LC Services brochure;CDC milk storage guidelines;CDC Guidelines for Breast Pump Cleaning  Discharge Pump: Manual;DEBP;Personal  Consult Status Consult Status: Follow-up Date: 12/15/23 Follow-up type: In-patient    Emily Lin 12/14/2023, 2:43 PM

## 2023-12-14 NOTE — Lactation Note (Signed)
 This note was copied from a baby's chart. Lactation Consultation Note  Patient Name: Emily Lin Unijb'd Date: 12/14/2023 Age:31 hours, 3rd visit  Reason for consult: Follow-up assessment After mom was assisted to the bathroom after the 1st feeding, LC Went back to assist to latch on the right breast, football position and baby latched with depth, swallows increased with warm compress on the breast and mom comfortable. Baby still feeding and his nurse [facility] aware to complete the documentation.  LC plan and recommendations:  Breast shells while awake between feedings to elongate the nipple / areola complex.  Feed with cues and by 3 hours STS after the baby has a diaper check.  Prior to latch, hand express, pre-pump with the hand pump and reverse pressure.  Latch with firm support. Football recommended until the baby is consistently latching. Per mom more comfortable with the latch.  Nipple Shield was not needed for the latch.     Maternal Data Has patient been taught Hand Expression?: Yes Does the patient have breastfeeding experience prior to this delivery?: No  Feeding Mother's Current Feeding Choice: Breast Milk  LATCH Score Latch: Grasps breast easily, tongue down, lips flanged, rhythmical sucking.  Audible Swallowing: A few with stimulation (increased to 2)  Type of Nipple: Everted at rest and after stimulation (some areola edema)  Comfort (Breast/Nipple): Soft / non-tender  Hold (Positioning): Assistance needed to correctly position infant at breast and maintain latch.  LATCH Score: 8   Lactation Tools Discussed/Used Tools: Shells;Pump;Flanges Nipple shield size: Other (comment) (NS not needed for latch) Flange Size: 18 Breast pump type: Manual Pump Education: Setup, frequency, and cleaning;Milk Storage Reason for Pumping: Nipple Shield was not needed for the latch 10/ 27 at 1420  Interventions Interventions: Breast feeding basics reviewed;Assisted with  latch;Skin to skin;Breast massage;Hand express;Pre-pump if needed;Reverse pressure;Breast compression;Adjust position;Support pillows;Shells;Hand pump;Education;LC Services brochure;CDC milk storage guidelines;CDC Guidelines for Breast Pump Cleaning  Discharge Pump: DEBP;Manual;Hands Free;Personal  Consult Status Consult Status: Follow-up Date: 12/15/23 Follow-up type: In-patient    Rollene Caldron Stephana Morell 12/14/2023, 3:29 PM

## 2023-12-14 NOTE — Lactation Note (Signed)
 This note was copied from a baby's chart. Lactation Consultation Note  Patient Name: Emily Lin Unijb'd Date: 12/14/2023 Age:31 hours Reason for consult: RN request;Mother's request;Difficult latch;Primapara;Term  P1. RN called for latch assistance. When baby did latch it was to painful for mom. LC went to assist in latching. Gave mom hand pump. Pre-pump to evert nipple more. Got baby latched in football. Mom drew up in pain. She couldn't take baby on the nipple. He was clamping down. LC checked mouth and suction. Baby has slightly high palate and chomps instead of extending tongue out past the gum line suckling.  Mom's nipples are red. Applied #20 NS. Attempted to latch. Mom tolerated that much better. Baby just wasn't that interested in BF. Baby had several times of attempting to have emesis. LC sat baby upright and patted on back. Used bulb syringe to clear fluid from mouth. Explained this is why baby isn't hungry right now. Try again in 3 hrs. Gave mom shells and hand pump #18 flange.  Maternal Data Has patient been taught Hand Expression?: Yes Does the patient have breastfeeding experience prior to this delivery?: No  Feeding    LATCH Score Latch: Too sleepy or reluctant, no latch achieved, no sucking elicited.  Audible Swallowing: None  Type of Nipple: Everted at rest and after stimulation  Comfort (Breast/Nipple): Filling, red/small blisters or bruises, mild/mod discomfort (red and sore)  Hold (Positioning): Assistance needed to correctly position infant at breast and maintain latch.  LATCH Score: 4   Lactation Tools Discussed/Used Tools: Shells;Pump;Flanges;Nipple Shields Nipple shield size: 20 Flange Size: 18 Breast pump type: Manual Reason for Pumping: NS  Interventions Interventions: Breast feeding basics reviewed;Assisted with latch;Skin to skin;Breast massage;Hand express;Pre-pump if needed;Breast compression;Adjust position;Support pillows;Position  options;Shells;Hand pump;Education  Discharge Discharge Education: Outpatient recommendation  Consult Status Consult Status: Follow-up Date: 12/14/23 Follow-up type: In-patient    Tomia Enlow G 12/14/2023, 5:59 AM

## 2023-12-14 NOTE — Progress Notes (Signed)
 Post Partum Day 1 Subjective: Doing well. Feeling perineal soreness. Has not been able to walk properly since delivery due to continued numbness in her right leg, using stedy/RN assistance to get to the bathroom. She had an epidural placed prior to delivery. Voiding well. Tolerating PO. Lochia appropriate.   Objective: Blood pressure 115/84, pulse 61, temperature 98.2 F (36.8 C), temperature source Oral, resp. rate 18, height 5' 3 (1.6 m), weight 69.2 kg, last menstrual period 01/03/2023, SpO2 100%, unknown if currently breastfeeding.  Physical Exam:  General: alert, cooperative, and no distress Lochia: appropriate Uterine Fundus: firm DVT Evaluation: No evidence of DVT seen on physical exam.  Recent Labs    12/13/23 0123 12/14/23 0451  HGB 12.0 10.3*  HCT 36.1 31.4*    Assessment/Plan: Emily Lin is a 33 year old G4 now P57 s/p SVD at [redacted]w[redacted]d after IOL at term. -PPD1: Doing well. Continue routine postpartum care. -continued RLE numbness: if not resolved by 24 hours postpartum, will request anesthesia eval.  -subclinical hypothyroid: on 25mcg Synthroid, ordered -Rh neg: baby boy also Rh neg, Rhogam not indicated.   Dispo: Anticipate discharge tomorrow.   LOS: 1 day   Emily DELENA Husky, MD 12/14/2023, 9:11 AM

## 2023-12-14 NOTE — Anesthesia Postprocedure Evaluation (Signed)
 Anesthesia Post Note  Patient: Emily Lin  Procedure(s) Performed: AN AD HOC LABOR EPIDURAL     Patient location during evaluation: Mother Baby Anesthesia Type: Epidural Level of consciousness: awake and alert and oriented Pain management: satisfactory to patient Vital Signs Assessment: post-procedure vital signs reviewed and stable Respiratory status: respiratory function stable Cardiovascular status: stable Postop Assessment: no headache, no backache, epidural receding, patient able to bend at knees, no signs of nausea or vomiting, adequate PO intake and able to ambulate Anesthetic complications: no   No notable events documented.  Last Vitals:  Vitals:   12/14/23 0600 12/14/23 0913  BP: 115/84 112/83  Pulse: 61 61  Resp: 18 18  Temp: 36.8 C 36.7 C  SpO2: 100% 98%    Last Pain:  Vitals:   12/14/23 1738  TempSrc:   PainSc: 1    Pain Goal:                   Rachel Samples

## 2023-12-15 LAB — BIRTH TISSUE RECOVERY COLLECTION (PLACENTA DONATION)

## 2023-12-15 MED ORDER — IBUPROFEN 600 MG PO TABS: 600.0000 mg | ORAL_TABLET | Freq: Four times a day (QID) | ORAL | 0 refills | Status: AC | PRN

## 2023-12-15 MED ORDER — ACETAMINOPHEN 325 MG PO TABS: 650.0000 mg | ORAL_TABLET | ORAL | Status: AC | PRN

## 2023-12-15 NOTE — Plan of Care (Signed)
  Problem: Education: Goal: Knowledge of General Education information will improve Description: Including pain rating scale, medication(s)/side effects and non-pharmacologic comfort measures Outcome: Completed/Met   Problem: Health Behavior/Discharge Planning: Goal: Ability to manage health-related needs will improve Outcome: Completed/Met   Problem: Clinical Measurements: Goal: Ability to maintain clinical measurements within normal limits will improve Outcome: Completed/Met Goal: Will remain free from infection Outcome: Completed/Met Goal: Diagnostic test results will improve Outcome: Completed/Met Goal: Respiratory complications will improve Outcome: Completed/Met Goal: Cardiovascular complication will be avoided Outcome: Completed/Met   Problem: Activity: Goal: Risk for activity intolerance will decrease Outcome: Completed/Met   Problem: Nutrition: Goal: Adequate nutrition will be maintained Outcome: Completed/Met   Problem: Coping: Goal: Level of anxiety will decrease Outcome: Completed/Met   Problem: Elimination: Goal: Will not experience complications related to bowel motility Outcome: Completed/Met Goal: Will not experience complications related to urinary retention Outcome: Completed/Met   Problem: Pain Managment: Goal: General experience of comfort will improve and/or be controlled Outcome: Completed/Met   Problem: Safety: Goal: Ability to remain free from injury will improve Outcome: Completed/Met   Problem: Skin Integrity: Goal: Risk for impaired skin integrity will decrease Outcome: Completed/Met   Problem: Education: Goal: Knowledge of Childbirth will improve Outcome: Completed/Met Goal: Ability to make informed decisions regarding treatment and plan of care will improve Outcome: Completed/Met Goal: Ability to state and carry out methods to decrease the pain will improve Outcome: Completed/Met Goal: Individualized Educational Video(s) Outcome:  Completed/Met   Problem: Coping: Goal: Ability to verbalize concerns and feelings about labor and delivery will improve Outcome: Completed/Met   Problem: Life Cycle: Goal: Ability to make normal progression through stages of labor will improve Outcome: Completed/Met Goal: Ability to effectively push during vaginal delivery will improve Outcome: Completed/Met   Problem: Role Relationship: Goal: Will demonstrate positive interactions with the child Outcome: Completed/Met   Problem: Safety: Goal: Risk of complications during labor and delivery will decrease Outcome: Completed/Met   Problem: Pain Management: Goal: Relief or control of pain from uterine contractions will improve Outcome: Completed/Met   Problem: Education: Goal: Knowledge of condition will improve Outcome: Completed/Met Goal: Individualized Educational Video(s) Outcome: Completed/Met Goal: Individualized Newborn Educational Video(s) Outcome: Completed/Met   Problem: Activity: Goal: Will verbalize the importance of balancing activity with adequate rest periods Outcome: Completed/Met Goal: Ability to tolerate increased activity will improve Outcome: Completed/Met   Problem: Coping: Goal: Ability to identify and utilize available resources and services will improve Outcome: Completed/Met   Problem: Life Cycle: Goal: Chance of risk for complications during the postpartum period will decrease Outcome: Completed/Met   Problem: Role Relationship: Goal: Ability to demonstrate positive interaction with newborn will improve Outcome: Completed/Met   Problem: Skin Integrity: Goal: Demonstration of wound healing without infection will improve Outcome: Completed/Met   Problem: Education: Goal: Knowledge of condition will improve Outcome: Completed/Met Goal: Individualized Educational Video(s) Outcome: Completed/Met Goal: Individualized Newborn Educational Video(s) Outcome: Completed/Met   Problem:  Activity: Goal: Will verbalize the importance of balancing activity with adequate rest periods Outcome: Completed/Met Goal: Ability to tolerate increased activity will improve Outcome: Completed/Met   Problem: Coping: Goal: Ability to identify and utilize available resources and services will improve Outcome: Completed/Met   Problem: Life Cycle: Goal: Chance of risk for complications during the postpartum period will decrease Outcome: Completed/Met   Problem: Role Relationship: Goal: Ability to demonstrate positive interaction with newborn will improve Outcome: Completed/Met   Problem: Skin Integrity: Goal: Demonstration of wound healing without infection will improve Outcome: Completed/Met

## 2023-12-15 NOTE — Progress Notes (Signed)
 Post Partum Day 2 Subjective: Bruised breasts from newborn chopping while feeding. Pumping. Pain controlled with meds. Able to ambulate and void with some discomfort  Right sided neck pain and headache  Objective: Blood pressure 108/89, pulse 77, temperature 98.7 F (37.1 C), resp. rate 18, height 5' 3 (1.6 m), weight 69.2 kg, last menstrual period 01/03/2023, SpO2 97%, unknown if currently breastfeeding.  Physical Exam:  General: alert, cooperative, and no distress Lochia: appropriate Uterine Fundus: firm DVT Evaluation: No significant calf/ankle edema.  Recent Labs    12/13/23 0123 12/14/23 0451  HGB 12.0 10.3*  HCT 36.1 31.4*    Assessment/Plan: 33 yo G4 now P1 031 PPD#2 s/p 41 wk IOL - PP: routine care - Rh negative: baby is also Rh negative  - Subclinical hypothyroidism: continue levothyroxine  - Desires circumcision.   Discussed r/b/a of the procedure.  Reviewed that circumcision is an elective surgical procedure and not considered medically necessary.  Reviewed the risks of the procedure including the risk of infection, bleeding, damage to surrounding structures, including scrotum, shaft, urethra and head of penis, and an undesired cosmetic effect requiring additional procedures for revision. - Dispo: DC home   LOS: 2 days   Larraine DELENA Sharps, DO 12/15/2023, 11:52 AM

## 2023-12-15 NOTE — Discharge Instructions (Signed)
 Call office with any concerns [phone]

## 2023-12-15 NOTE — Discharge Summary (Signed)
 Postpartum Discharge Summary  Date of Service updated     Patient Name: Emily Lin DOB: 31-Mar-1990 MRN: [id number]  Date of admission: 12/13/2023 Delivery date:12/13/2023 Delivering provider: DELANA BASE [facility] Date of discharge: 12/15/2023  Admitting diagnosis: Personal history of previous postdates pregnancy [Z87.59] Intrauterine pregnancy: [redacted]w[redacted]d     Secondary diagnosis:  Principal Problem:   Personal history of previous postdates pregnancy  Additional problems: None    Discharge diagnosis: Term Pregnancy Delivered                                              Post partum procedures:None Augmentation: Pitocin and Cytotec Complications: None  Hospital course: Induction of Labor With Vaginal Delivery   33 y.o. yo [id number] at [redacted]w[redacted]d was admitted to the hospital 12/13/2023 for induction of labor.  Indication for induction: Postdates.  Patient had an uncomplicated labor course. Membrane Rupture Time/Date: 7:30 AM,12/13/2023  Delivery Method:Vaginal, Spontaneous Operative Delivery:N/A Episiotomy: None;Median Lacerations:  2nd degree;Perineal Details of delivery can be found in separate delivery note.  Patient had an uncomplicated postpartum course.. Patient is discharged home 12/15/23.  Newborn Data: Birth date:12/13/2023 Birth time:7:24 PM Gender:Female Living status:Living Apgars:8 ,9  Weight:3941 g  Magnesium Sulfate received: No BMZ received: No Rhophylac :N/A MMR:N/A T-DaP:Given prenatally Flu: Given prenatally RSV Vaccine received: Given prenatally Transfusion:No Immunizations administered: Immunization History  Administered Date(s) Administered   Tdap 07/18/2016, 10/02/2020    Physical exam  Vitals:   12/14/23 0600 12/14/23 0913 12/14/23 2045 12/15/23 0521  BP: 115/84 112/83 122/72 108/89  Pulse: 61 61 79 77  Resp: 18 18 18 18   Temp: 98.2 F (36.8 C) 98.1 F (36.7 C) 98.4 F (36.9 C) 98.7 F (37.1 C)  TempSrc: Oral Oral Oral   SpO2: 100% 98%  98% 97%  Weight:      Height:       General: alert, cooperative, and no distress Lochia: appropriate Uterine Fundus: firm DVT Evaluation: No cords or calf tenderness. No significant calf/ankle edema. Labs: Lab Results  Component Value Date   WBC 13.7 (H) 12/14/2023   HGB 10.3 (L) 12/14/2023   HCT 31.4 (L) 12/14/2023   MCV 89.0 12/14/2023   PLT 173 12/14/2023      Latest Ref Rng & Units 04/16/2023    4:54 PM  CMP  Glucose 70 - 99 mg/dL 74   BUN 6 - 20 mg/dL 11   Creatinine 9.42 - 1.00 mg/dL 9.32   Sodium 865 - 855 mmol/L 137   Potassium 3.5 - 5.2 mmol/L 4.3   Chloride 96 - 106 mmol/L 102   CO2 20 - 29 mmol/L 21   Calcium 8.7 - 10.2 mg/dL 9.5   Total Protein 6.0 - 8.5 g/dL 6.7   Total Bilirubin 0.0 - 1.2 mg/dL 0.4   Alkaline Phos 44 - 121 IU/L 51   AST 0 - 40 IU/L 18   ALT 0 - 32 IU/L 14    Edinburgh Score:     No data to display            After visit meds:  Allergies as of 12/15/2023   No Known Allergies      Medication List     TAKE these medications    acetaminophen  325 MG tablet Commonly known as: Tylenol  Take 2 tablets (650 mg total) by mouth every 4 (four) hours as needed  for moderate pain (pain score 4-6).   CoQ-10 100 MG Caps Take 1 capsule by mouth daily.   ferrous sulfate 325 (65 FE) MG tablet Take 325 mg by mouth daily with breakfast.   ibuprofen  600 MG tablet Commonly known as: ADVIL  Take 1 tablet (600 mg total) by mouth every 6 (six) hours as needed for cramping.   levothyroxine 25 MCG tablet Commonly known as: SYNTHROID Take 25 mcg by mouth daily before breakfast.   prenatal vitamin w/FE, FA 27-1 MG Tabs tablet Take 1 tablet by mouth daily at 12 noon.   Probiotic Daily Caps Take 1 capsule by mouth daily.   Vitamin D3 50 MCG (2000 UT) capsule Take 2,000 Units by mouth daily.         Discharge home in stable condition Infant Feeding: Breast Infant Disposition:home with mother Discharge instruction: per After Visit  Summary and Postpartum booklet. Activity: Advance as tolerated. Pelvic rest for 6 weeks.  Diet: routine diet Anticipated Birth Control: Unsure Postpartum Appointment:6 weeks Additional Postpartum F/U: Postpartum Depression checkup Future Appointments:No future appointments. Follow up Visit:  Follow-up Information     Claudene Mort A, DO Follow up in 6 week(s).   Specialty: Obstetrics and Gynecology Why: Office will call you to schedule your postpartum appointment Contact information: [address], [place] [address] [place] [address] [phone]                     12/15/2023 Mort DELENA Claudene, DO

## 2023-12-15 NOTE — Lactation Note (Signed)
 This note was copied from a baby's chart. Lactation Consultation Note  Patient Name: Emily Lin Unijb'd Date: 12/15/2023 Age:31 hours Reason for consult: Mother's request;Primapara;Term;Nipple pain/trauma  P1. Mom wanted to BF baby but she stated it was hurting to bad. Mom was in the recliner holding baby crying. Baby cueing wanting to feed. Mom stated he is chomping to bad. Asked mom about putting on the NS, mom agree. LC did so #20 baby latched and BF well. Mom stated it was a little tender but nothing like something chomping on her nipple. Parents wanted to know why was the baby doing this and how to stop him from chomping and suckle right w/o hurting. LC noticed baby appears to have limited tongue movement. Baby can't stick his tongue out past his gums. No frenulum visibly noted. When baby came off of the NS no colostrum noted. It was dry. Suggested mom pump and if she can collect something then we will give it to the baby. Mom pumped collected 10 ml. Praised mom. Mom gave to baby in bottle. Mom shown how to use DEBP & how to disassemble, clean, & reassemble parts. Suggested mom pump q 3 hrs. Mom still tearful talking to her, explained all of this behavior from baby. Gave parents information of contacts to have baby checked for frenulum issues.   Maternal Data    Feeding    LATCH Score Latch: Grasps breast easily, tongue down, lips flanged, rhythmical sucking.  Audible Swallowing: A few with stimulation  Type of Nipple: Everted at rest and after stimulation  Comfort (Breast/Nipple): Filling, red/small blisters or bruises, mild/mod discomfort  Hold (Positioning): Assistance needed to correctly position infant at breast and maintain latch.  LATCH Score: 7   Lactation Tools Discussed/Used Tools: Pump;Shells;Flanges;Nipple Shields Nipple shield size: 20 Flange Size: 18 Breast pump type: Double-Electric Breast Pump Pump Education: Setup, frequency, and cleaning;Milk  Storage Reason for Pumping: supplementation Pumping frequency: q 3hr Pumped volume: 10 mL  Interventions Interventions: Breast feeding basics reviewed;Assisted with latch;Skin to skin;Breast massage;Hand express;Breast compression;Adjust position;Support pillows;Position options;Expressed milk;Shells;DEBP;Education;Pace feeding  Discharge    Consult Status Consult Status: Follow-up Date: 12/15/23 Follow-up type: In-patient    Sumaya Riedesel G 12/15/2023, 12:03 AM

## 2023-12-29 ENCOUNTER — Telehealth (HOSPITAL_COMMUNITY): Payer: Self-pay | Admitting: *Deleted

## 2023-12-29 NOTE — Telephone Encounter (Signed)
 Patient answered and reported that she is currently in a lactation appointment. Requested a call back at a later time. Allean IVAR Carton, RN, 12/29/23, [phone]

## 2023-12-30 ENCOUNTER — Telehealth (HOSPITAL_COMMUNITY): Payer: Self-pay | Admitting: *Deleted

## 2023-12-30 NOTE — Telephone Encounter (Signed)
 12/30/2023  Name: Emily Lin MRN: [id number] DOB: [birth year]  Reason for Call:  Transition of Care Hospital Discharge Call  Contact Status: Patient Contact Status: Complete  Language assistant needed: Interpreter Mode: Interpreter Not Needed        Follow-Up Questions: Do You Have Any Concerns About Your Health As You Heal From Delivery?: No Do You Have Any Concerns About Your Infants Health?: No  Edinburgh Postnatal Depression Scale:  In the Past 7 Days: I have been able to laugh and see the funny side of things.: As much as I always could I have looked forward with enjoyment to things.: As much as I ever did I have blamed myself unnecessarily when things went wrong.: No, never I have been anxious or worried for no good reason.: Yes, sometimes I have felt scared or panicky for no good reason.: Yes, sometimes Things have been getting on top of me.: No, most of the time I have coped quite well I have been so unhappy that I have had difficulty sleeping.: Not at all I have felt sad or miserable.: Not very often I have been so unhappy that I have been crying.: Only occasionally The thought of harming myself has occurred to me.: Never Edinburgh Postnatal Depression Scale Total: 7  PHQ2-9 Depression Scale:     Discharge Follow-up: Edinburgh score requires follow up?: No Patient was advised of the following resources:: Support Group, Breastfeeding Support Group  Post-discharge interventions: Reviewed Newborn Safe Sleep Practices  Mliss Sieve, RN 12/30/2023 09:59
